# Patient Record
Sex: Female | Born: 1985 | Race: White | Hispanic: No | Marital: Single | Smoking: Former smoker
Health system: Southern US, Community
[De-identification: ages and names within clinical notes are randomized; demographics above are authoritative.]

## PROBLEM LIST (undated history)

## (undated) HISTORY — PX: WISDOM TOOTH EXTRACTION: SHX21

---

## 2020-12-24 ENCOUNTER — Encounter: Payer: Self-pay | Admitting: Internal Medicine

## 2020-12-24 ENCOUNTER — Other Ambulatory Visit: Payer: Self-pay

## 2020-12-24 ENCOUNTER — Ambulatory Visit: Payer: 59 | Admitting: Internal Medicine

## 2020-12-24 VITALS — BP 100/66 | HR 75 | Temp 98.6°F | Resp 16 | Ht 62.0 in | Wt 130.0 lb

## 2020-12-24 DIAGNOSIS — M542 Cervicalgia: Secondary | ICD-10-CM | POA: Diagnosis not present

## 2020-12-24 DIAGNOSIS — R519 Headache, unspecified: Secondary | ICD-10-CM

## 2020-12-24 DIAGNOSIS — R292 Abnormal reflex: Secondary | ICD-10-CM | POA: Diagnosis not present

## 2020-12-24 DIAGNOSIS — G4486 Cervicogenic headache: Secondary | ICD-10-CM | POA: Diagnosis not present

## 2020-12-24 DIAGNOSIS — Z124 Encounter for screening for malignant neoplasm of cervix: Secondary | ICD-10-CM

## 2020-12-24 DIAGNOSIS — Z0001 Encounter for general adult medical examination with abnormal findings: Secondary | ICD-10-CM | POA: Diagnosis not present

## 2020-12-24 LAB — LIPID PANEL
Cholesterol: 175 mg/dL (ref 0–200)
HDL: 77.8 mg/dL (ref >39.00)
LDL Cholesterol: 78 mg/dL (ref 0–99)
NonHDL: 97.15
Total CHOL/HDL Ratio: 2
Triglycerides: 95 mg/dL (ref 0.0–149.0)
VLDL: 19 mg/dL (ref 0.0–40.0)

## 2020-12-24 LAB — CBC WITH DIFFERENTIAL/PLATELET
Basophils Absolute: 0 10*3/uL (ref 0.0–0.1)
Basophils Relative: 0.3 % (ref 0.0–3.0)
Eosinophils Absolute: 0 10*3/uL (ref 0.0–0.7)
Eosinophils Relative: 0.5 % (ref 0.0–5.0)
HCT: 38 % (ref 36.0–46.0)
Hemoglobin: 12.9 g/dL (ref 12.0–15.0)
Lymphocytes Relative: 20 % (ref 12.0–46.0)
Lymphs Abs: 1.8 10*3/uL (ref 0.7–4.0)
MCHC: 34 g/dL (ref 30.0–36.0)
MCV: 92.8 fl (ref 78.0–100.0)
Monocytes Absolute: 0.5 10*3/uL (ref 0.1–1.0)
Monocytes Relative: 5.2 % (ref 3.0–12.0)
Neutro Abs: 6.5 10*3/uL (ref 1.4–7.7)
Neutrophils Relative %: 74 % (ref 43.0–77.0)
Platelets: 346 10*3/uL (ref 150.0–400.0)
RBC: 4.09 Mil/uL (ref 3.87–5.11)
RDW: 13.4 % (ref 11.5–15.5)
WBC: 8.8 10*3/uL (ref 4.0–10.5)

## 2020-12-24 LAB — HEPATIC FUNCTION PANEL
ALT: 11 U/L (ref 0–35)
AST: 16 U/L (ref 0–37)
Albumin: 4.4 g/dL (ref 3.5–5.2)
Alkaline Phosphatase: 38 U/L — ABNORMAL LOW (ref 39–117)
Bilirubin, Direct: 0.1 mg/dL (ref 0.0–0.3)
Total Bilirubin: 0.5 mg/dL (ref 0.2–1.2)
Total Protein: 7.2 g/dL (ref 6.0–8.3)

## 2020-12-24 LAB — BASIC METABOLIC PANEL
BUN: 13 mg/dL (ref 6–23)
CO2: 24 mEq/L (ref 19–32)
Calcium: 9.3 mg/dL (ref 8.4–10.5)
Chloride: 104 mEq/L (ref 96–112)
Creatinine, Ser: 0.81 mg/dL (ref 0.40–1.20)
GFR: 95.47 mL/min (ref >60.00)
Glucose, Bld: 82 mg/dL (ref 70–99)
Potassium: 3.9 mEq/L (ref 3.5–5.1)
Sodium: 137 mEq/L (ref 135–145)

## 2020-12-24 LAB — SEDIMENTATION RATE: Sed Rate: 8 mm/hr (ref 0–20)

## 2020-12-24 NOTE — Progress Notes (Signed)
Subjective:  Patient ID: Olivia Garner, female    DOB: [birth year]  Age: 35 y.o. MRN: [id number]  CC: Annual Exam and Headache  This visit occurred during the SARS-CoV-2 public health emergency.  Safety protocols were in place, including screening questions prior to the visit, additional usage of staff PPE, and extensive cleaning of exam room while observing appropriate contact time as indicated for disinfecting solutions.    HPI Olivia Garner presents for a CPX and to establish.  She complains of a 14-month history of right posterior headache that she describes as sharp in sensation.  She denies blurred vision, hearing disturbance, paresthesias, nausea, or vomiting.  She says the headaches last a few seconds and she feels stunned but then they quickly resolved.  She also has discomfort in her right upper neck that radiates into the occipital region.  History Olivia Garner has no past medical history on file.   She has a past surgical history that includes Wisdom tooth extraction.   Her family history includes Alcohol abuse in her maternal grandfather; Arthritis in her maternal grandmother and mother; Asthma in her maternal grandmother; Cancer in her maternal grandmother; Depression in her maternal grandmother and mother; Diabetes in her maternal grandmother; Drug abuse in her mother; Healthy in her father; Heart disease in her maternal grandmother and mother; Hyperlipidemia in her maternal grandmother; Hypertension in her mother; Kidney disease in her mother; Mental illness in her mother; Stroke in her maternal grandmother and mother.She reports that she has quit smoking. Her smoking use included cigarettes. She has never used smokeless tobacco. She reports that she does not drink alcohol and does not use drugs.  No outpatient medications prior to visit.   No facility-administered medications prior to visit.    ROS Review of Systems  Constitutional: Negative.   HENT: Negative.     Eyes: Negative.  Negative for photophobia and visual disturbance.  Respiratory:  Negative for cough and shortness of breath.   Cardiovascular:  Negative for chest pain, palpitations and leg swelling.  Gastrointestinal:  Negative for abdominal pain, diarrhea, nausea and vomiting.  Endocrine: Negative.   Genitourinary: Negative.   Musculoskeletal:  Positive for neck pain.  Skin: Negative.  Negative for color change and pallor.  Neurological:  Positive for headaches. Negative for dizziness, tremors, syncope, facial asymmetry, speech difficulty, weakness, light-headedness and numbness.  Hematological:  Negative for adenopathy. Does not bruise/bleed easily.  Psychiatric/Behavioral: Negative.     Objective:  BP 100/66 (BP Location: Right Arm, Patient Position: Sitting, Cuff Size: Large)   Pulse 75   Temp 98.6 F (37 C) (Oral)   Resp 16   Ht 5\' 2"  (1.575 m)   Wt 130 lb (59 kg)   LMP 12/10/2020 (Exact Date)   SpO2 98%   BMI 23.78 kg/m   Physical Exam HENT:     Nose: Nose normal.     Mouth/Throat:     Mouth: Mucous membranes are moist.  Eyes:     General: No scleral icterus.    Extraocular Movements: Extraocular movements intact.     Right eye: Normal extraocular motion and no nystagmus.     Left eye: Normal extraocular motion and no nystagmus.     Pupils: Pupils are equal, round, and reactive to light. Pupils are equal.  Cardiovascular:     Rate and Rhythm: Normal rate and regular rhythm.     Heart sounds: No murmur heard. Pulmonary:     Effort: Pulmonary effort is normal.  Breath sounds: No stridor. No wheezing, rhonchi or rales.  Abdominal:     General: Abdomen is flat.     Palpations: There is no mass.     Tenderness: There is no abdominal tenderness. There is no guarding.     Hernia: No hernia is present.  Musculoskeletal:        General: Normal range of motion.     Cervical back: Neck supple.     Right lower leg: No edema.     Left lower leg: No edema.  Skin:     General: Skin is warm and dry.  Neurological:     General: No focal deficit present.     Mental Status: She is alert.     Cranial Nerves: Cranial nerves are intact. No cranial nerve deficit.     Sensory: Sensation is intact. No sensory deficit.     Motor: Motor function is intact. No weakness.     Coordination: Coordination is intact. Coordination normal.     Gait: Gait normal.     Deep Tendon Reflexes: Reflexes abnormal. Babinski sign absent on the right side. Babinski sign absent on the left side.     Reflex Scores:      Tricep reflexes are 0 on the right side and 0 on the left side.      Bicep reflexes are 0 on the right side and 0 on the left side.      Brachioradialis reflexes are 0 on the right side and 0 on the left side.      Patellar reflexes are 1+ on the right side and 0 on the left side.      Achilles reflexes are 0 on the right side and 0 on the left side. Psychiatric:        Mood and Affect: Mood normal.    Lab Results  Component Value Date   WBC 8.8 12/24/2020   HGB 12.9 12/24/2020   HCT 38.0 12/24/2020   PLT 346.0 12/24/2020   GLUCOSE 82 12/24/2020   CHOL 175 12/24/2020   TRIG 95.0 12/24/2020   HDL 77.80 12/24/2020   LDLCALC 78 12/24/2020   ALT 11 12/24/2020   AST 16 12/24/2020   NA 137 12/24/2020   K 3.9 12/24/2020   CL 104 12/24/2020   CREATININE 0.81 12/24/2020   BUN 13 12/24/2020   CO2 24 12/24/2020   TSH 1.52 12/24/2020     Assessment & Plan:   Olivia Garner was seen today for annual exam and headache.  Diagnoses and all orders for this visit:  Encounter for general adult medical examination with abnormal findings- Exam completed, labs reviewed, vaccines are up-to-date, cancer screenings addressed, patient education is given. -     Lipid panel; Future -     HIV Antibody (routine testing w rflx); Future -     HIV Antibody (routine testing w rflx) -     Lipid panel  Cervicogenic headache- She has head and neck pain and an abnormal reflex.  I  recommended that she undergo an MRI/MRA to screen for aneurysm, mass, bleed, NPH, or cervical disc disease. Her labs are reassuring. -     CBC with Differential/Platelet; Future -     Basic metabolic panel; Future -     TSH; Future -     Hepatic function panel; Future -     Sedimentation rate; Future -     MR Cervical Spine Wo Contrast; Future -     Sedimentation rate -  Hepatic function panel -     TSH -     Basic metabolic panel -     CBC with Differential/Platelet  Nonintractable episodic headache, unspecified headache type -     CBC with Differential/Platelet; Future -     Basic metabolic panel; Future -     TSH; Future -     Hepatic function panel; Future -     Sedimentation rate; Future -     MR ANGIO HEAD WO W CONTRAST; Future -     MR Brain W Wo Contrast; Future -     Sedimentation rate -     Hepatic function panel -     TSH -     Basic metabolic panel -     CBC with Differential/Platelet  Reflexes abnormal -     CBC with Differential/Platelet; Future -     Basic metabolic panel; Future -     TSH; Future -     Hepatic function panel; Future -     Sedimentation rate; Future -     MR ANGIO HEAD WO W CONTRAST; Future -     MR Brain W Wo Contrast; Future -     MR Cervical Spine Wo Contrast; Future -     Sedimentation rate -     Hepatic function panel -     TSH -     Basic metabolic panel -     CBC with Differential/Platelet  Cervicalgia -     MR Cervical Spine Wo Contrast; Future  Olivia Garner does not currently have medications on file.  No orders of the defined types were placed in this encounter.    Follow-up: Return in about 3 months (around 03/26/2021).  Sanda Linger, MD

## 2020-12-24 NOTE — Patient Instructions (Signed)
General Headache Without Cause A headache is pain or discomfort felt around the head or neck area. The specific cause of a headache may not be found. There are many causes and types of headaches. A few common ones are: Tension headaches. Migraine headaches. Cluster headaches. Chronic daily headaches. Follow these instructions at home: Watch your condition for any changes. Let your health care provider know aboutthem. Take these steps to help with your condition: Managing pain     Take over-the-counter and prescription medicines only as told by your health care provider. Lie down in a dark, quiet room when you have a headache. If directed, put ice on your head and neck area: Put ice in a plastic bag. Place a towel between your skin and the bag. Leave the ice on for 20 minutes, 2-3 times per day. If directed, apply heat to the affected area. Use the heat source that your health care provider recommends, such as a moist heat pack or a heating pad. Place a towel between your skin and the heat source. Leave the heat on for 20-30 minutes. Remove the heat if your skin turns bright red. This is especially important if you are unable to feel pain, heat, or cold. You may have a greater risk of getting burned. Keep lights dim if bright lights bother you or make your headaches worse. Eating and drinking Eat meals on a regular schedule. If you drink alcohol: Limit how much you use to: 0-1 drink a day for women. 0-2 drinks a day for men. Be aware of how much alcohol is in your drink. In the [place] one drink equals one 12 oz bottle of beer (355 mL), one 5 oz glass of wine (148 mL), or one 1 oz glass of hard liquor (44 mL). Stop drinking caffeine, or decrease the amount of caffeine you drink. General instructions  Keep a headache journal to help find out what may trigger your headaches. For example, write down: What you eat and drink. How much sleep you get. Any change to your diet or  medicines. Try massage or other relaxation techniques. Limit stress. Sit up straight, and do not tense your muscles. Do not use any products that contain nicotine or tobacco, such as cigarettes, e-cigarettes, and chewing tobacco. If you need help quitting, ask your health care provider. Exercise regularly as told by your health care provider. Sleep on a regular schedule. Get 7-9 hours of sleep each night, or the amount recommended by your health care provider. Keep all follow-up visits as told by your health care provider. This is important.  Contact a health care provider if: Your symptoms are not helped by medicine. You have a headache that is different from the usual headache. You have nausea or you vomit. You have a fever. Get help right away if: Your headache becomes severe quickly. Your headache gets worse after moderate to intense physical activity. You have repeated vomiting. You have a stiff neck. You have a loss of vision. You have problems with speech. You have pain in the eye or ear. You have muscular weakness or loss of muscle control. You lose your balance or have trouble walking. You feel faint or pass out. You have confusion. You have a seizure. Summary A headache is pain or discomfort felt around the head or neck area. There are many causes and types of headaches. In some cases, the cause may not be found. Keep a headache journal to help find out what may trigger your headaches. Watch   your condition for any changes. Let your health care provider know about them. Contact a health care provider if you have a headache that is different from the usual headache, or if your symptoms are not helped by medicine. Get help right away if your headache becomes severe, you vomit, you have a loss of vision, you lose your balance, or you have a seizure. This information is not intended to replace advice given to you by your health care provider. Make sure you discuss any questions  you have with your healthcare provider. Document Revised: 12/12/2017 Document Reviewed: 12/12/2017 Elsevier Patient Education  2022 [employer]  

## 2020-12-25 LAB — HIV ANTIBODY (ROUTINE TESTING W REFLEX): HIV 1&2 Ab, 4th Generation: NONREACTIVE

## 2020-12-25 LAB — TSH: TSH: 1.52 u[IU]/mL (ref 0.35–5.50)

## 2020-12-28 ENCOUNTER — Encounter: Payer: Self-pay | Admitting: Internal Medicine

## 2020-12-28 DIAGNOSIS — Z124 Encounter for screening for malignant neoplasm of cervix: Secondary | ICD-10-CM | POA: Insufficient documentation

## 2021-01-09 ENCOUNTER — Other Ambulatory Visit: Payer: Managed Care, Other (non HMO)

## 2021-01-09 ENCOUNTER — Ambulatory Visit
Admission: RE | Admit: 2021-01-09 | Discharge: 2021-01-09 | Disposition: A | Discharge status: Home / Self Care | Payer: BC Managed Care – PPO | Source: Ambulatory Visit | Attending: Internal Medicine | Admitting: Internal Medicine

## 2021-01-09 DIAGNOSIS — R292 Abnormal reflex: Secondary | ICD-10-CM

## 2021-01-09 DIAGNOSIS — R519 Headache, unspecified: Secondary | ICD-10-CM

## 2021-01-09 DIAGNOSIS — G4486 Cervicogenic headache: Secondary | ICD-10-CM

## 2021-01-09 DIAGNOSIS — M542 Cervicalgia: Secondary | ICD-10-CM

## 2021-01-09 MED ORDER — GADOBENATE DIMEGLUMINE 529 MG/ML IV SOLN
12.0000 mL | Freq: Once | INTRAVENOUS | Status: AC | PRN
  Administered 2021-01-09: 12 mL via INTRAVENOUS

## 2021-01-10 ENCOUNTER — Other Ambulatory Visit: Payer: Self-pay | Admitting: Internal Medicine

## 2021-01-10 DIAGNOSIS — M4802 Spinal stenosis, cervical region: Secondary | ICD-10-CM | POA: Insufficient documentation

## 2021-01-28 ENCOUNTER — Encounter: Payer: Self-pay | Admitting: Physical Medicine and Rehabilitation

## 2021-02-24 DIAGNOSIS — N854 Malposition of uterus: Secondary | ICD-10-CM | POA: Insufficient documentation

## 2021-03-04 ENCOUNTER — Other Ambulatory Visit: Payer: Self-pay

## 2021-03-04 ENCOUNTER — Encounter
Payer: BC Managed Care – PPO | Attending: Physical Medicine and Rehabilitation | Admitting: Physical Medicine and Rehabilitation

## 2021-03-04 VITALS — BP 120/77 | HR 100 | Temp 98.9°F | Ht 62.0 in | Wt 131.2 lb

## 2021-03-04 DIAGNOSIS — M4802 Spinal stenosis, cervical region: Secondary | ICD-10-CM | POA: Diagnosis not present

## 2021-03-04 DIAGNOSIS — G4486 Cervicogenic headache: Secondary | ICD-10-CM | POA: Insufficient documentation

## 2021-03-04 DIAGNOSIS — M7918 Myalgia, other site: Secondary | ICD-10-CM | POA: Diagnosis not present

## 2021-03-04 NOTE — Progress Notes (Addendum)
Subjective:    Patient ID: Olivia Garner, female    DOB: 1985/10/30, 35 y.o.   MRN: [id number]  HPI Olivia Garner is a 35 year old woman who present to establish care for chronic neck pain.  1) Cervical foraminal stenosis -Had a cervical MRI and is not sure of the results. Believes she has a disc herniation -she has never done physical therapy and does not feel that she needs to at this time -she is very active in the gym with weights and wants to make sure this is safe for her -she has some right sided neck pain that is 5/10, pain right now is 0/10, and pain feels intermittent, sharp, and stabbing  2) Cervicogenic headache -located on the right side- feels tight  3) Cervical myofascial pain -has tight cervical muscles -plans to invest in massage  Pain Inventory Average Pain 5 Pain Right Now 0 My pain is intermittent, sharp, and stabbing  In the last 24 hours, has pain interfered with the following? General activity 0 Relation with others 0 Enjoyment of life 0 What TIME of day is your pain at its worst? morning  and daytime Sleep (in general) Good  Pain is worse with: inactivity and some activites Pain improves with: therapy/exercise Relief from Meds:  na  walk without assistance how many minutes can you walk? unlimited ability to climb steps?  yes do you drive?  yes  employed # of hrs/week 40+  tingling spasms dizziness depression anxiety  CT/MRI  Primary care Olivia Linger, MD    Family History  Problem Relation Age of Onset   Stroke Mother    Kidney disease Mother    Hypertension Mother    Heart disease Mother    Drug abuse Mother    Depression Mother    Arthritis Mother    Mental illness Mother    Healthy Father    Stroke Maternal Grandmother    Hyperlipidemia Maternal Grandmother    Heart disease Maternal Grandmother    Diabetes Maternal Grandmother    Depression Maternal Grandmother    Cancer Maternal Grandmother    Asthma Maternal  Grandmother    Arthritis Maternal Grandmother    Alcohol abuse Maternal Grandfather    Social History   Socioeconomic History   Marital status: Single    Spouse name: Not on file   Number of children: Not on file   Years of education: Not on file   Highest education level: Not on file  Occupational History   Not on file  Tobacco Use   Smoking status: Former    Types: Cigarettes   Smokeless tobacco: Never  Substance and Sexual Activity   Alcohol use: Never   Drug use: Never   Sexual activity: Yes    Partners: Male  Other Topics Concern   Not on file  Social History Narrative   Not on file   Social Determinants of Health   Financial Resource Strain: Not on file  Food Insecurity: Not on file  Transportation Needs: Not on file  Physical Activity: Not on file  Stress: Not on file  Social Connections: Not on file   Past Surgical History:  Procedure Laterality Date   WISDOM TOOTH EXTRACTION     No past medical history on file. BP 120/77   Pulse 100   Temp 98.9 F (37.2 C) (Oral)   Ht 5\' 2"  (1.575 m)   Wt 131 lb 3.2 oz (59.5 kg)   SpO2 97%   BMI 24.00  kg/m   Opioid Risk Score:   Fall Risk Score:  `1  Depression screen PHQ 2/9  Depression screen [facility] 2/9 03/04/2021 12/24/2020  Decreased Interest 1 0  Down, Depressed, Hopeless 1 0  PHQ - 2 Score 2 0  Altered sleeping 0 -  Tired, decreased energy 1 -  Change in appetite 0 -  Feeling bad or failure about yourself  2 -  Trouble concentrating 1 -  Moving slowly or fidgety/restless 0 -  Suicidal thoughts 0 -  PHQ-9 Score 6 -  Difficult doing work/chores Somewhat difficult -      Review of Systems  Musculoskeletal:  Positive for neck pain.  All other systems reviewed and are negative.     Objective:   Physical Exam Gen: no distress, normal appearing HEENT: oral mucosa pink and moist, NCAT Cardio: Reg rate Chest: normal effort, normal rate of breathing Abd: soft, non-distended Ext: no edema Psych:  pleasant, normal affect Skin: intact Neuro: Alert Musculoskeletal: Negative Spurling's test bilateral. +cervical lordosis, tight cervical myofascia    Assessment & Plan:   Olivia Garner is a 35 year old woman who present to establish care for chronic neck pain.  1) Cervical foraminal stenosis: -Discussed current symptoms of pain and history of pain.  -Discussed benefits of exercise in reducing pain. MRI Cervical Spine from August reviewed and shows following: Mild cervical spondylosis, as described and most notably as follows. At C5-C6, there is a small disc bulge. Superimposed shallow broad-based right center/foraminal disc protrusion. Minimal partial effacement of the ventral thecal sac (without spinal cord mass effect). Minimal right neural foraminal narrowing. Mild nonspecific reversal of the expected cervical lordosis. Discussed these results with patient -Discussed following foods that may reduce pain: 1) Ginger (especially studied for arthritis)- reduce leukotriene production to decrease inflammation 2) Blueberries- high in phytonutrients that decrease inflammation 3) Salmon- marine omega-3s reduce joint swelling and pain 4) Pumpkin seeds- reduce inflammation 5) dark chocolate- reduces inflammation 6) turmeric- reduces inflammation 7) tart cherries - reduce pain and stiffness 8) extra virgin olive oil - its compound olecanthal helps to block prostaglandins  9) chili peppers- can be eaten or applied topically via capsaicin 10) mint- helpful for headache, muscle aches, joint pain, and itching 11) garlic- reduces inflammation  Link to further information on diet for chronic pain: [email or web address]   2) Cervicogenic headache -Educated that this is a headache that often results from neck pain -Good cervical posture can help limit cervical myofascial pain and also limit cervicogenic headache -provided with home  exercise program -discussed benefits of PT and OT, will defer for now   3) cervical myofasical pain syndrome -discussed prescription muscle relaxers as an option if pain becomes particularly severe -recommended massage, heat application

## 2021-03-04 NOTE — Patient Instructions (Addendum)
Down Dog  -Discussed following foods that may reduce pain: 1) Ginger (especially studied for arthritis)- reduce leukotriene production to decrease inflammation 2) Blueberries- high in phytonutrients that decrease inflammation 3) Salmon- marine omega-3s reduce joint swelling and pain 4) Pumpkin seeds- reduce inflammation 5) dark chocolate- reduces inflammation 6) turmeric- reduces inflammation 7) tart cherries - reduce pain and stiffness 8) extra virgin olive oil - its compound olecanthal helps to block prostaglandins  9) chili peppers- can be eaten or applied topically via capsaicin 10) mint- helpful for headache, muscle aches, joint pain, and itching 11) garlic- reduces inflammation  Link to further information on diet for chronic pain: [email or web address]

## 2021-03-26 ENCOUNTER — Telehealth: Payer: BC Managed Care – PPO | Admitting: Physician Assistant

## 2021-03-26 DIAGNOSIS — J111 Influenza due to unidentified influenza virus with other respiratory manifestations: Secondary | ICD-10-CM | POA: Diagnosis not present

## 2021-03-26 MED ORDER — OSELTAMIVIR PHOSPHATE 75 MG PO CAPS
75.0000 mg | ORAL_CAPSULE | Freq: Two times a day (BID) | ORAL | 0 refills | Status: DC
Start: 2021-03-26 — End: 2021-10-07

## 2021-03-26 NOTE — Patient Instructions (Signed)
Olivia Garner, thank you for joining [name], PA-C for today's virtual visit.  While this provider is not your primary care provider (PCP), if your PCP is located in our provider database this encounter information will be shared with them immediately following your visit.  Consent: (Patient) Olivia Garner provided verbal consent for this virtual visit at the beginning of the encounter.  Current Medications:  Current Outpatient Medications:    oseltamivir (TAMIFLU) 75 MG capsule, Take 1 capsule (75 mg total) by mouth 2 (two) times daily., Disp: 10 capsule, Rfl: 0   Medications ordered in this encounter:  Meds ordered this encounter  Medications   oseltamivir (TAMIFLU) 75 MG capsule    Sig: Take 1 capsule (75 mg total) by mouth 2 (two) times daily.    Dispense:  10 capsule    Refill:  0    Order Specific Question:   Supervising Provider    Answer:   Hyacinth Meeker, BRIAN [[id number]]     *If you need refills on other medications prior to your next appointment, please contact your pharmacy*  Follow-Up: Call back or seek an in-person evaluation if the symptoms worsen or if the condition fails to improve as anticipated.  Other Instructions Influenza, Adult Influenza is also called "the flu." It is an infection in the lungs, nose, and throat (respiratory tract). It spreads easily from person to person (is contagious). The flu causes symptoms that are like a cold, along with high fever and body aches. What are the causes? This condition is caused by the influenza virus. You can get the virus by: Breathing in droplets that are in the air after a person infected with the flu coughed or sneezed. Touching something that has the virus on it and then touching your mouth, nose, or eyes. What increases the risk? Certain things may make you more likely to get the flu. These include: Not washing your hands often. Having close contact with many people during cold and flu  season. Touching your mouth, eyes, or nose without first washing your hands. Not getting a flu shot every year. You may have a higher risk for the flu, and serious problems, such as a lung infection (pneumonia), if you: Are older than 65. Are pregnant. Have a weakened disease-fighting system (immune system) because of a disease or because you are taking certain medicines. Have a long-term (chronic) condition, such as: Heart, kidney, or lung disease. Diabetes. Asthma. Have a liver disorder. Are very overweight (morbidly obese). Have anemia. What are the signs or symptoms? Symptoms usually begin suddenly and last 4-14 days. They may include: Fever and chills. Headaches, body aches, or muscle aches. Sore throat. Cough. Runny or stuffy (congested) nose. Feeling discomfort in your chest. Not wanting to eat as much as normal. Feeling weak or tired. Feeling dizzy. Feeling sick to your stomach or throwing up. How is this treated? If the flu is found early, you can be treated with antiviral medicine. This can help to reduce how bad the illness is and how long it lasts. This may be given by mouth or through an IV tube. Taking care of yourself at home can help your symptoms get better. Your doctor may want you to: Take over-the-counter medicines. Drink plenty of fluids. The flu often goes away on its own. If you have very bad symptoms or other problems, you may be treated in a hospital. Follow these instructions at home:   Activity Rest as needed. Get plenty of sleep. Stay home  from work or school as told by your doctor. Do not leave home until you do not have a fever for 24 hours without taking medicine. Leave home only to go to your doctor. Eating and drinking Take an ORS (oral rehydration solution). This is a drink that is sold at pharmacies and stores. Drink enough fluid to keep your pee pale yellow. Drink clear fluids in small amounts as you are able. Clear fluids  include: Water. Ice chips. Fruit juice mixed with water. Low-calorie sports drinks. Eat bland foods that are easy to digest. Eat small amounts as you are able. These foods include: Bananas. Applesauce. Rice. Lean meats. Toast. Crackers. Do not eat or drink: Fluids that have a lot of sugar or caffeine. Alcohol. Spicy or fatty foods. General instructions Take over-the-counter and prescription medicines only as told by your doctor. Use a cool mist humidifier to add moisture to the air in your home. This can make it easier for you to breathe. When using a cool mist humidifier, clean it daily. Empty water and replace with clean water. Cover your mouth and nose when you cough or sneeze. Wash your hands with soap and water often and for at least 20 seconds. This is also important after you cough or sneeze. If you cannot use soap and water, use alcohol-based hand sanitizer. Keep all follow-up visits. How is this prevented?  Get a flu shot every year. You may get the flu shot in late summer, fall, or winter. Ask your doctor when you should get your flu shot. Avoid contact with people who are sick during fall and winter. This is cold and flu season. Contact a doctor if: You get new symptoms. You have: Chest pain. Watery poop (diarrhea). A fever. Your cough gets worse. You start to have more mucus. You feel sick to your stomach. You throw up. Get help right away if you: Have shortness of breath. Have trouble breathing. Have skin or nails that turn a bluish color. Have very bad pain or stiffness in your neck. Get a sudden headache. Get sudden pain in your face or ear. Cannot eat or drink without throwing up. These symptoms may represent a serious problem that is an emergency. Get medical help right away. Call your local emergency services (911 in the [place] Do not wait to see if the symptoms will go away. Do not drive yourself to the hospital. Summary Influenza is also called  "the flu." It is an infection in the lungs, nose, and throat. It spreads easily from person to person. Take over-the-counter and prescription medicines only as told by your doctor. Getting a flu shot every year is the best way to not get the flu. This information is not intended to replace advice given to you by your health care provider. Make sure you discuss any questions you have with your health care provider. Document Revised: 01/11/2020 Document Reviewed: 01/11/2020 Elsevier Patient Education  2022 [employer].    If you have been instructed to have an in-person evaluation today at a local Urgent Care facility, please use the link below. It will take you to a list of all of our available [facility], including address, phone number and hours of operation. Please do not delay care.  [facility]  If you or a family member do not have a primary care provider, use the link below to schedule a visit and establish care. When you choose a [facility] primary care physician or advanced practice  provider, you gain a long-term partner in health. Find a Primary Care Provider  Learn more about [facility] in-office and virtual care options: [facility]

## 2021-03-26 NOTE — Progress Notes (Signed)
Virtual Visit Consent   Olivia Garner, you are scheduled for a virtual visit with a [facility] provider today.     Just as with appointments in the office, your consent must be obtained to participate.  Your consent will be active for this visit and any virtual visit you may have with one of our providers in the next 365 days.     If you have a MyChart account, a copy of this consent can be sent to you electronically.  All virtual visits are billed to your insurance company just like a traditional visit in the office.    As this is a virtual visit, video technology does not allow for your provider to perform a traditional examination.  This may limit your provider's ability to fully assess your condition.  If your provider identifies any concerns that need to be evaluated in person or the need to arrange testing (such as labs, EKG, etc.), we will make arrangements to do so.     Although advances in technology are sophisticated, we cannot ensure that it will always work on either your end or our end.  If the connection with a video visit is poor, the visit may have to be switched to a telephone visit.  With either a video or telephone visit, we are not always able to ensure that we have a secure connection.     I need to obtain your verbal consent now.   Are you willing to proceed with your visit today?    Olivia Garner has provided verbal consent on 03/26/2021 for a virtual visit (video or telephone).   [name], PA-C   Date: 03/26/2021 8:35 AM   Virtual Visit via Video Note   I, [name], connected with  Olivia Garner  ([id number], 09/28/1985) on 03/26/21 at  8:30 AM EDT by a video-enabled telemedicine application and verified that I am speaking with the correct person using two identifiers.  Location: Patient: Virtual Visit Location Patient: Home Provider: Virtual Visit Location Provider: Home Office   I discussed the limitations of evaluation  and management by telemedicine and the availability of in person appointments. The patient expressed understanding and agreed to proceed.    History of Present Illness: Olivia Garner is a 35 y.o. who identifies as a female who was assigned female at birth, and is being seen today for flu-like illness.  HPI: URI  This is a new problem. The problem has been gradually worsening. There has been no fever. Associated symptoms include congestion, coughing (dry), rhinorrhea, sinus pain and a sore throat. Pertinent negatives include no diarrhea, ear pain, nausea, plugged ear sensation or vomiting. Associated symptoms comments: Had laryngitis yesterday, body aches, fatigue. Treatments tried: mucinex, dayquil. The treatment provided mild relief.  No known sick contacts.  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  Resting comfortably at home.  Head is normocephalic, atraumatic.  No labored breathing.  Speech is clear and coherent with logical content.  Patient is alert and oriented at baseline.    Assessment and Plan: 1. Influenza - oseltamivir (TAMIFLU) 75 MG capsule; Take 1 capsule (75 mg total) by mouth 2 (two) times daily.  Dispense: 10 capsule; Refill: 0  - Suspected viral URI with negative covid testing - Possible flu as flu A is prevalent in the community  at this time - Limited testing availability that would delay appropriate treatment waiting on results - Tamiflu prescribed for possible flu - Work note provided - Push fluids - Symptomatic management OTC of choice as needed - Seek in person evaluation if symptoms worsen or fail to improve  Follow Up Instructions: I discussed the assessment and treatment plan with the patient. The patient was provided an opportunity to ask questions and all were answered. The patient agreed with the plan and demonstrated an understanding of the instructions.  A copy of instructions were sent to the patient via MyChart unless otherwise noted below.   The patient was advised to call back or seek an in-person evaluation if the symptoms worsen or if the condition fails to improve as anticipated.  Time:  I spent 11 minutes with the patient via telehealth technology discussing the above problems/concerns.    [name], PA-C

## 2021-04-22 LAB — HM PAP SMEAR

## 2021-09-30 ENCOUNTER — Ambulatory Visit: Payer: BC Managed Care – PPO | Admitting: Family Medicine

## 2021-10-07 ENCOUNTER — Encounter: Payer: Self-pay | Admitting: Family Medicine

## 2021-10-07 ENCOUNTER — Ambulatory Visit: Payer: BC Managed Care – PPO | Admitting: Family Medicine

## 2021-10-07 VITALS — BP 110/74 | HR 88 | Temp 97.7°F | Ht 62.0 in | Wt 130.0 lb

## 2021-10-07 DIAGNOSIS — R002 Palpitations: Secondary | ICD-10-CM | POA: Diagnosis not present

## 2021-10-07 LAB — COMPREHENSIVE METABOLIC PANEL
ALT: 11 U/L (ref 0–35)
AST: 17 U/L (ref 0–37)
Albumin: 4.4 g/dL (ref 3.5–5.2)
Alkaline Phosphatase: 51 U/L (ref 39–117)
BUN: 13 mg/dL (ref 6–23)
CO2: 26 mEq/L (ref 19–32)
Calcium: 9.4 mg/dL (ref 8.4–10.5)
Chloride: 106 mEq/L (ref 96–112)
Creatinine, Ser: 0.8 mg/dL (ref 0.40–1.20)
GFR: 96.37 mL/min (ref >60.00)
Glucose, Bld: 85 mg/dL (ref 70–99)
Potassium: 3.8 mEq/L (ref 3.5–5.1)
Sodium: 139 mEq/L (ref 135–145)
Total Bilirubin: 1 mg/dL (ref 0.2–1.2)
Total Protein: 7.4 g/dL (ref 6.0–8.3)

## 2021-10-07 LAB — CBC WITH DIFFERENTIAL/PLATELET
Basophils Absolute: 0 10*3/uL (ref 0.0–0.1)
Basophils Relative: 0.3 % (ref 0.0–3.0)
Eosinophils Absolute: 0 10*3/uL (ref 0.0–0.7)
Eosinophils Relative: 0.7 % (ref 0.0–5.0)
HCT: 39.7 % (ref 36.0–46.0)
Hemoglobin: 13.5 g/dL (ref 12.0–15.0)
Lymphocytes Relative: 31.3 % (ref 12.0–46.0)
Lymphs Abs: 1.8 10*3/uL (ref 0.7–4.0)
MCHC: 33.9 g/dL (ref 30.0–36.0)
MCV: 92.7 fl (ref 78.0–100.0)
Monocytes Absolute: 0.4 10*3/uL (ref 0.1–1.0)
Monocytes Relative: 6.3 % (ref 3.0–12.0)
Neutro Abs: 3.5 10*3/uL (ref 1.4–7.7)
Neutrophils Relative %: 61.4 % (ref 43.0–77.0)
Platelets: 319 10*3/uL (ref 150.0–400.0)
RBC: 4.29 Mil/uL (ref 3.87–5.11)
RDW: 13.5 % (ref 11.5–15.5)
WBC: 5.6 10*3/uL (ref 4.0–10.5)

## 2021-10-07 LAB — T4, FREE: Free T4: 0.77 ng/dL (ref 0.60–1.60)

## 2021-10-07 LAB — TSH: TSH: 1.34 u[IU]/mL (ref 0.35–5.50)

## 2021-10-07 NOTE — Progress Notes (Signed)
? ?Acute Office Visit ? ?Subjective:  ? ?  ?Patient ID: Olivia Garner, female    DOB: 1986-02-26, 36 y.o.   MRN: [id number] ? ?Chief Complaint  ?Patient presents with  ? Palpitations  ?  Since July of last year noticed "flutters" more often. Happens every day  ? ? ?HPI ?Patient is in today for palpitations. Intermittent since July 2022. Becoming more frequent, has been daily for the past 6 months. Occurs while exercising in the morning, normally at the end of her workout. Lasts only a few seconds.  ?She also notices a fast heart rate in the evening. Does not notice it during the day.  ?Feels a fluttering sensation in her chest. At times she feels like a "jump" in her chest and loses her breath.  ? ?States her pulse with exercise has been as high as 180-202.  ?She was doing a 5K and had to stop running and walk.  ? ?Denies syncope but has felt faint before.  ? ?Denies fever, chills, dizziness, chest pain, shortness of breath, abdominal pain, N/V/D, urinary symptoms, LE edema.  ? ? ?She typically does not consume a lot of caffeine. States she has 1 cup of coffee in the morning. No nicotine ? ? ?No known family hx of cardiac disease or sudden death ? ?LMP: regular monthly cycles, not heavy  ?On OCPs.  ? ?History reviewed. No pertinent past medical history. ?Current Outpatient Medications on File Prior to Visit  ?Medication Sig Dispense Refill  ? norgestimate-ethinyl estradiol (ORTHO-CYCLEN) 0.25-35 MG-MCG tablet Sprintec (28) 0.25 mg-35 mcg tablet    ? ?No current facility-administered medications on file prior to visit.  ? ? ? ? ? ?ROS ?Pertinent positives and negatives in the history of present illness. ? ? ?   ?Objective:  ?  ?BP 110/74 (BP Location: Left Arm, Patient Position: Sitting, Cuff Size: Large)   Pulse 88   Temp 97.7 ?F (36.5 ?C) (Temporal)   Ht 5\' 2"  (1.575 m)   Wt 130 lb (59 kg)   LMP 09/02/2021 (Within Days)   SpO2 98%   BMI 23.78 kg/m?  ?BP Readings from Last 3 Encounters:  ?10/07/21 110/74   ?03/04/21 120/77  ?12/24/20 100/66  ? ?Wt Readings from Last 3 Encounters:  ?10/07/21 130 lb (59 kg)  ?03/04/21 131 lb 3.2 oz (59.5 kg)  ?12/24/20 130 lb (59 kg)  ? ?  ? ?Physical Exam ?Constitutional:   ?   Appearance: Normal appearance. She is not ill-appearing or diaphoretic.  ?Eyes:  ?   Conjunctiva/sclera: Conjunctivae normal.  ?Cardiovascular:  ?   Rate and Rhythm: Normal rate and regular rhythm.  ?   Pulses: Normal pulses.  ?   Heart sounds: Normal heart sounds.  ?Pulmonary:  ?   Effort: Pulmonary effort is normal.  ?   Breath sounds: Normal breath sounds.  ?Musculoskeletal:  ?   Cervical back: Normal range of motion and neck supple.  ?Skin: ?   General: Skin is warm and dry.  ?Neurological:  ?   General: No focal deficit present.  ?   Mental Status: She is alert and oriented to person, place, and time.  ?Psychiatric:     ?   Mood and Affect: Mood normal.     ?   Behavior: Behavior normal.  ? ? ?Results for orders placed or performed in visit on 10/07/21  ?T4, free  ?Result Value Ref Range  ? Free T4 0.77 0.60 - 1.60 ng/dL  ?TSH  ?Result Value  Ref Range  ? TSH 1.34 0.35 - 5.50 uIU/mL  ?Comprehensive metabolic panel  ?Result Value Ref Range  ? Sodium 139 135 - 145 mEq/L  ? Potassium 3.8 3.5 - 5.1 mEq/L  ? Chloride 106 96 - 112 mEq/L  ? CO2 26 19 - 32 mEq/L  ? Glucose, Bld 85 70 - 99 mg/dL  ? BUN 13 6 - 23 mg/dL  ? Creatinine, Ser 0.80 0.40 - 1.20 mg/dL  ? Total Bilirubin 1.0 0.2 - 1.2 mg/dL  ? Alkaline Phosphatase 51 39 - 117 U/L  ? AST 17 0 - 37 U/L  ? ALT 11 0 - 35 U/L  ? Total Protein 7.4 6.0 - 8.3 g/dL  ? Albumin 4.4 3.5 - 5.2 g/dL  ? GFR 96.37 >60.00 mL/min  ? Calcium 9.4 8.4 - 10.5 mg/dL  ?CBC with Differential/Platelet  ?Result Value Ref Range  ? WBC 5.6 4.0 - 10.5 K/uL  ? RBC 4.29 3.87 - 5.11 Mil/uL  ? Hemoglobin 13.5 12.0 - 15.0 g/dL  ? HCT 39.7 36.0 - 46.0 %  ? MCV 92.7 78.0 - 100.0 fl  ? MCHC 33.9 30.0 - 36.0 g/dL  ? RDW 13.5 11.5 - 15.5 %  ? Platelets 319.0 150.0 - 400.0 K/uL  ? Neutrophils  Relative % 61.4 43.0 - 77.0 %  ? Lymphocytes Relative 31.3 12.0 - 46.0 %  ? Monocytes Relative 6.3 3.0 - 12.0 %  ? Eosinophils Relative 0.7 0.0 - 5.0 %  ? Basophils Relative 0.3 0.0 - 3.0 %  ? Neutro Abs 3.5 1.4 - 7.7 K/uL  ? Lymphs Abs 1.8 0.7 - 4.0 K/uL  ? Monocytes Absolute 0.4 0.1 - 1.0 K/uL  ? Eosinophils Absolute 0.0 0.0 - 0.7 K/uL  ? Basophils Absolute 0.0 0.0 - 0.1 K/uL  ? ? ? ?   ?Assessment & Plan:  ? ?Problem List Items Addressed This Visit   ? ?  ? Other  ? Palpitations - Primary  ?  Intermittent and ongoing since July 2022, worsening. No chest pain or syncope history.  ?EKG shows NSR, rate 81, no acute changes  ?I will check labs including thyroid panel and also get a 2 week event monitor.  ?Follow up if worsening or pending results.  ? ?  ?  ? Relevant Orders  ? EKG 12-Lead  ? Cardiac event monitor  ? CBC with Differential/Platelet (Completed)  ? Comprehensive metabolic panel (Completed)  ? TSH (Completed)  ? T4, free (Completed)  ? ?Visit time 22 minutes in face to face communication with patient and coordination of care, additional 12 minutes spent in record review, coordination or care, ordering tests, communicating/referring to other healthcare professionals, documenting in medical records all on the same day of the visit for total time 34 minutes spent on the visit.  ? ? ?No orders of the defined types were placed in this encounter. ? ? ?No follow-ups on file. ? ?Hetty Blend, NP-C ? ? ?

## 2021-10-07 NOTE — Patient Instructions (Signed)
Go to the first floor for labs before you leave today. ? ?I am ordering a 2-week cardiac event monitor for you.  You should be contacted in the next couple of days regarding getting this set up. ? ?We will be in touch with your results. ? ? ?Palpitations ?Palpitations are feelings that your heartbeat is irregular or is faster than normal. It may feel like your heart is fluttering or skipping a beat. Palpitations may be caused by many things, including smoking, caffeine, alcohol, stress, and certain medicines or drugs. Most causes of palpitations are not serious.  ?However, some palpitations can be a sign of a serious problem. Further tests and a thorough medical history will be done to find the cause of your palpitations. Your provider may order tests such as an ECG, labs, an echocardiogram, or an ambulatory continuous ECG monitor. ?Follow these instructions at home: ?Pay attention to any changes in your symptoms. Let your health care provider know about them. Take these actions to help manage your symptoms: ?Eating and drinking ?Follow instructions from your health care provider about eating or drinking restrictions. You may need to avoid foods and drinks that may cause palpitations. These may include: ?Caffeinated coffee, tea, soft drinks, and energy drinks. ?Chocolate. ?Alcohol. ?Diet pills. ?Lifestyle ? ?  ? ?Take steps to reduce your stress and anxiety. Things that can help you relax include: ?Yoga. ?Mind-body activities, such as deep breathing, meditation, or using words and images to create positive thoughts (guided imagery). ?Physical activity, such as swimming, jogging, or walking. Tell your health care provider if your palpitations increase with activity. If you have chest pain or shortness of breath with activity, do not continue the activity until you are seen by your health care provider. ?Biofeedback. This is a method that helps you learn to use your mind to control things in your body, such as your  heartbeat. ?Get plenty of rest and sleep. Keep a regular bed time. ?Do not use drugs, including cocaine or ecstasy. Do not use marijuana. ?Do not use any products that contain nicotine or tobacco. These products include cigarettes, chewing tobacco, and vaping devices, such as e-cigarettes. If you need help quitting, ask your health care provider. ?General instructions ?Take over-the-counter and prescription medicines only as told by your health care provider. ?Keep all follow-up visits. This is important. These may include visits for further testing if palpitations do not go away or get worse. ?Contact a health care provider if: ?You continue to have a fast or irregular heartbeat for a long period of time. ?You notice that your palpitations occur more often. ?Get help right away if: ?You have chest pain or shortness of breath. ?You have a severe headache. ?You feel dizzy or you faint. ?These symptoms may represent a serious problem that is an emergency. Do not wait to see if the symptoms will go away. Get medical help right away. Call your local emergency services (911 in the [place] Do not drive yourself to the hospital. ?Summary ?Palpitations are feelings that your heartbeat is irregular or is faster than normal. It may feel like your heart is fluttering or skipping a beat. ?Palpitations may be caused by many things, including smoking, caffeine, alcohol, stress, certain medicines, and drugs. ?Further tests and a thorough medical history may be done to find the cause of your palpitations. ?Get help right away if you faint or have chest pain, shortness of breath, severe headache, or dizziness. ?This information is not intended to replace advice given to  you by your health care provider. Make sure you discuss any questions you have with your health care provider. ?Document Revised: 10/15/2020 Document Reviewed: 10/15/2020 ?Elsevier Patient Education ? [employer]. ? ?

## 2021-10-07 NOTE — Assessment & Plan Note (Addendum)
Intermittent and ongoing since July 2022, worsening. No chest pain or syncope history.  ?EKG shows NSR, rate 81, no acute changes  ?I will check labs including thyroid panel and also get a 2 week event monitor.  ?Follow up if worsening or pending results.  ?

## 2021-10-29 ENCOUNTER — Ambulatory Visit (INDEPENDENT_AMBULATORY_CARE_PROVIDER_SITE_OTHER): Payer: BC Managed Care – PPO

## 2021-10-29 DIAGNOSIS — R002 Palpitations: Secondary | ICD-10-CM

## 2021-11-04 ENCOUNTER — Ambulatory Visit: Payer: BC Managed Care – PPO | Admitting: Internal Medicine

## 2021-12-02 NOTE — Progress Notes (Signed)
Please call her with my recommendations and if she is having any new or worsening symptoms as mentioned in my note, we can refer her to cardiology.

## 2021-12-29 ENCOUNTER — Telehealth: Payer: Self-pay | Admitting: Internal Medicine

## 2021-12-29 NOTE — Telephone Encounter (Signed)
Pt due for OV. She has been scheduled for 7/31 @ 11am.  CMA will hold immunization form until that time.

## 2021-12-29 NOTE — Telephone Encounter (Signed)
PT visits today with an immunization record form to be filled out. Form has been left in Dr.Jones' mailbox. PT would like to be notified once this is filled out.  CB: [phone]

## 2022-01-04 ENCOUNTER — Encounter: Payer: Self-pay | Admitting: Internal Medicine

## 2022-01-04 ENCOUNTER — Ambulatory Visit: Payer: BC Managed Care – PPO | Admitting: Internal Medicine

## 2022-01-04 VITALS — BP 104/66 | HR 92 | Temp 98.5°F | Ht 62.0 in | Wt 134.0 lb

## 2022-01-04 DIAGNOSIS — Z0000 Encounter for general adult medical examination without abnormal findings: Secondary | ICD-10-CM | POA: Diagnosis not present

## 2022-01-04 DIAGNOSIS — Z0001 Encounter for general adult medical examination with abnormal findings: Secondary | ICD-10-CM

## 2022-01-04 NOTE — Patient Instructions (Signed)

## 2022-01-04 NOTE — Progress Notes (Signed)
Subjective:  Patient ID: Olivia Garner, female    DOB: 12-Jan-1986  Age: 36 y.o. MRN: [id number]  CC: Annual Exam   HPI Olivia Garner presents for a CPX and f/up -  She needs documentation of titers and vaccines to enter graduate school.  She feels well and offers no complaints.  History Olivia Garner has no past medical history on file.   She has a past surgical history that includes Wisdom tooth extraction.   Her family history includes Alcohol abuse in her maternal grandfather; Arthritis in her maternal grandmother and mother; Asthma in her maternal grandmother; Cancer in her maternal grandmother; Depression in her maternal grandmother and mother; Diabetes in her maternal grandmother; Drug abuse in her mother; Healthy in her father; Heart disease in her maternal grandmother and mother; Hyperlipidemia in her maternal grandmother; Hypertension in her mother; Kidney disease in her mother; Mental illness in her mother; Stroke in her maternal grandmother and mother.She reports that she has quit smoking. Her smoking use included cigarettes. She has never used smokeless tobacco. She reports that she does not drink alcohol and does not use drugs.  Outpatient Medications Prior to Visit  Medication Sig Dispense Refill   norgestimate-ethinyl estradiol (ORTHO-CYCLEN) 0.25-35 MG-MCG tablet Sprintec (28) 0.25 mg-35 mcg tablet     No facility-administered medications prior to visit.    ROS Review of Systems  All other systems reviewed and are negative.   Objective:  BP 104/66 (BP Location: Left Arm, Patient Position: Sitting, Cuff Size: Large)   Pulse 92   Temp 98.5 F (36.9 C) (Oral)   Ht 5\' 2"  (1.575 m)   Wt 134 lb (60.8 kg)   SpO2 98%   BMI 24.51 kg/m   Physical Exam Vitals reviewed.  HENT:     Nose: Nose normal.     Mouth/Throat:     Mouth: Mucous membranes are moist.  Eyes:     General: No scleral icterus.    Conjunctiva/sclera: Conjunctivae normal.  Cardiovascular:      Rate and Rhythm: Normal rate and regular rhythm.     Heart sounds: No murmur heard. Pulmonary:     Effort: Pulmonary effort is normal.     Breath sounds: No stridor. No wheezing, rhonchi or rales.  Abdominal:     General: Abdomen is flat.     Palpations: There is no mass.     Tenderness: There is no abdominal tenderness. There is no guarding.     Hernia: No hernia is present.  Musculoskeletal:        General: Normal range of motion.     Cervical back: Neck supple.     Right lower leg: No edema.     Left lower leg: No edema.  Lymphadenopathy:     Cervical: No cervical adenopathy.  Skin:    General: Skin is warm and dry.  Neurological:     General: No focal deficit present.     Mental Status: She is alert.  Psychiatric:        Mood and Affect: Mood normal.        Behavior: Behavior normal.     Lab Results  Component Value Date   WBC 5.6 10/07/2021   HGB 13.5 10/07/2021   HCT 39.7 10/07/2021   PLT 319.0 10/07/2021   GLUCOSE 85 10/07/2021   CHOL 175 12/24/2020   TRIG 95.0 12/24/2020   HDL 77.80 12/24/2020   LDLCALC 78 12/24/2020   ALT 11 10/07/2021   AST 17 10/07/2021  NA 139 10/07/2021   K 3.8 10/07/2021   CL 106 10/07/2021   CREATININE 0.80 10/07/2021   BUN 13 10/07/2021   CO2 26 10/07/2021   TSH 1.34 10/07/2021     Assessment & Plan:   Olivia Garner was seen today for annual exam.  Diagnoses and all orders for this visit:  Encounter for general adult medical examination with abnormal findings-Exam completed, labs reviewed, vaccines are up-to-date, cancer screenings are up-to-date, patient education was given. -     Rubella screen; Future -     Rubeola antibody IgG; Future -     QuantiFERON-TB Gold Plus; Future -     Mumps antibody, IgG; Future -     Rubella screen -     Rubeola antibody IgG -     QuantiFERON-TB Gold Plus -     Mumps antibody, IgG   I am having Olivia Garner maintain her norgestimate-ethinyl estradiol.  No orders of the defined types  were placed in this encounter.    Follow-up: Return if symptoms worsen or fail to improve.  Sanda Linger, MD

## 2022-01-06 LAB — MUMPS ANTIBODY, IGG: Mumps IgG: <9 AU/mL — ABNORMAL LOW

## 2022-01-06 LAB — RUBEOLA ANTIBODY IGG: Rubeola IgG: 125 AU/mL

## 2022-01-06 LAB — QUANTIFERON-TB GOLD PLUS
Mitogen-NIL: 8.9 IU/mL
NIL: 0.01 IU/mL
QuantiFERON-TB Gold Plus: NEGATIVE
TB1-NIL: 0 IU/mL
TB2-NIL: 0.01 IU/mL

## 2022-01-06 LAB — RUBELLA SCREEN: Rubella: 1.44 Index

## 2022-01-22 ENCOUNTER — Encounter: Payer: Self-pay | Admitting: Internal Medicine

## 2022-12-16 ENCOUNTER — Telehealth: Payer: BC Managed Care – PPO | Admitting: Physician Assistant

## 2022-12-16 DIAGNOSIS — H109 Unspecified conjunctivitis: Secondary | ICD-10-CM | POA: Diagnosis not present

## 2022-12-16 MED ORDER — POLYMYXIN B-TRIMETHOPRIM 10000-0.1 UNIT/ML-% OP SOLN: 1.0000 [drp] | OPHTHALMIC | 0 refills | Status: DC

## 2022-12-16 NOTE — Progress Notes (Signed)
Virtual Visit Consent   Olivia Garner, you are scheduled for a virtual visit with a Olivia Garner provider today. Just as with appointments in the office, your consent must be obtained to participate. Your consent will be active for this visit and any virtual visit you may have with one of our providers in the next 365 days. If you have a MyChart account, a copy of this consent can be sent to you electronically.  As this is a virtual visit, video technology does not allow for your provider to perform a traditional examination. This may limit your provider's ability to fully assess your condition. If your provider identifies any concerns that need to be evaluated in person or the need to arrange testing (such as labs, EKG, etc.), we will make arrangements to do so. Although advances in technology are sophisticated, we cannot ensure that it will always work on either your end or our end. If the connection with a video visit is poor, the visit may have to be switched to a telephone visit. With either a video or telephone visit, we are not always able to ensure that we have a secure connection.  By engaging in this virtual visit, you consent to the provision of healthcare and authorize for your insurance to be billed (if applicable) for the services provided during this visit. Depending on your insurance coverage, you may receive a charge related to this service.  I need to obtain your verbal consent now. Are you willing to proceed with your visit today? [name] has provided verbal consent on 12/16/2022 for a virtual visit (video or telephone). [name], PA-C  Date: 12/16/2022 7:40 AM  Virtual Visit via Video Note   I, [name], connected with  Olivia Garner  ([id number], 06/21/1985) on 12/16/22 at  7:45 AM EDT by a video-enabled telemedicine application and verified that I am speaking with the correct person using two identifiers.  Location: Patient: Virtual  Visit Location Patient: Home Provider: Virtual Visit Location Provider: Home Office   I discussed the limitations of evaluation and management by telemedicine and the availability of in person appointments. The patient expressed understanding and agreed to proceed.    History of Present Illness: Olivia Garner is a 37 y.o. who identifies as a female who was assigned female at birth, and is being seen today for right eye irritation.  HPI: Eye Problem  The right eye is affected. This is a new problem. The current episode started yesterday (Around 3pm felt like she got an eyelash in her eye, tried to flush without improvement. Does still feel like something is in her eye and it moves around). The problem occurs constantly. The problem has been gradually worsening. There was no injury mechanism. The pain is mild. There is No known exposure to pink eye. She Wears contacts (havent worn since Monday). Associated symptoms include blurred vision, an eye discharge, eye redness and a foreign body sensation. Pertinent negatives include no double vision or fever. Treatments tried: cold compresses, eye wash. The treatment provided no relief.     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  Resting comfortably at home.  Head is normocephalic, atraumatic.  No labored breathing.  Speech is clear and coherent with logical content.   Patient is alert and oriented at baseline.  Right eye is injected with mild swelling of the upper and lower eyelids. Eyelids are erythematous as well  Assessment and Plan: 1. Bacterial conjunctivitis of right eye - trimethoprim-polymyxin b (POLYTRIM) ophthalmic solution; Place 1 drop into the right eye every 4 (four) hours.  Dispense: 10 mL; Refill: 0  - Suspect bacterial conjunctivitis - Polytrim prescribed - Cold compresses - Good hand hygiene - Seek in person evaluation if symptoms worsen or fail to improve   Follow Up Instructions: I discussed the assessment and treatment plan with the patient. The patient was provided an opportunity to ask questions and all were answered. The patient agreed with the plan and demonstrated an understanding of the instructions.  A copy of instructions were sent to the patient via MyChart unless otherwise noted below.    The patient was advised to call back or seek an in-person evaluation if the symptoms worsen or if the condition fails to improve as anticipated.  Time:  I spent 10 minutes with the patient via telehealth technology discussing the above problems/concerns.    [name], PA-C

## 2022-12-16 NOTE — Patient Instructions (Signed)
Olivia Garner, thank you for joining [name], PA-C for today's virtual visit.  While this provider is not your primary care provider (PCP), if your PCP is located in our provider database this encounter information will be shared with them immediately following your visit.   A [facility] MyChart account gives you access to today's visit and all your visits, tests, and labs performed at [facility] " click here if you don't have a [facility] MyChart account or go to [email or web address]  Consent: (Patient) Olivia Garner provided verbal consent for this virtual visit at the beginning of the encounter.  Current Medications:  Current Outpatient Medications:    trimethoprim-polymyxin b (POLYTRIM) ophthalmic solution, Place 1 drop into the right eye every 4 (four) hours., Disp: 10 mL, Rfl: 0   norgestimate-ethinyl estradiol (ORTHO-CYCLEN) 0.25-35 MG-MCG tablet, Sprintec (28) 0.25 mg-35 mcg tablet, Disp: , Rfl:    Medications ordered in this encounter:  Meds ordered this encounter  Medications   trimethoprim-polymyxin b (POLYTRIM) ophthalmic solution    Sig: Place 1 drop into the right eye every 4 (four) hours.    Dispense:  10 mL    Refill:  0    Order Specific Question:   Supervising Provider    Answer:   Merrilee Jansky [[id number]]     *If you need refills on other medications prior to your next appointment, please contact your pharmacy*  Follow-Up: Call back or seek an in-person evaluation if the symptoms worsen or if the condition fails to improve as anticipated.  [facility] [phone]  Other Instructions Bacterial Conjunctivitis, Adult Bacterial conjunctivitis is an infection of the clear membrane that covers the white part of the eye and the inner surface of the eyelid (conjunctiva). When the blood vessels in the conjunctiva become inflamed, the eye becomes red or pink. The eye often feels irritated or itchy. Bacterial  conjunctivitis spreads easily from person to person (is contagious). It also spreads easily from one eye to the other eye. What are the causes? This condition is caused by bacteria. You may get the infection if you come into close contact with: A person who is infected with the bacteria. Items that are contaminated with the bacteria, such as a face towel, contact lens solution, or eye makeup. What increases the risk? You are more likely to develop this condition if: You are exposed to other people who have the infection. You wear contact lenses. You have a sinus infection. You have had a recent eye injury or surgery. You have a weak body defense system (immune system). You have a medical condition that causes dry eyes. What are the signs or symptoms? Symptoms of this condition include: Thick, yellowish discharge from the eye. This may turn into a crust on the eyelid overnight and cause your eyelids to stick together. Tearing or watery eyes. Itchy eyes. Burning feeling in your eyes. Eye redness. Swollen eyelids. Blurred vision. How is this diagnosed? This condition is diagnosed based on your symptoms and medical history. Your health care provider may also take a sample of discharge from your eye to find the cause of your infection. How is this treated? This condition may be treated with: Antibiotic eye drops or ointment to clear the infection more quickly and prevent the spread of infection to others. Antibiotic medicines taken by mouth (orally) to treat infections that do not respond to drops or ointments or that last longer than 10 days. Cool, wet cloths (cool compresses)  placed on the eyes. Artificial tears applied 2-6 times a day. Follow these instructions at home: Medicines Take or apply your antibiotic medicine as told by your health care provider. Do not stop using the antibiotic, even if your condition improves, unless directed by your health care provider. Take or apply  over-the-counter and prescription medicines only as told by your health care provider. Be very careful to avoid touching the edge of your eyelid with the eye-drop bottle or the ointment tube when you apply medicines to the affected eye. This will keep you from spreading the infection to your other eye or to other people. Managing discomfort Gently wipe away any drainage from your eye with a warm, wet washcloth or a cotton ball. Apply a clean, cool compress to your eye for 10-20 minutes, 3-4 times a day. General instructions Do not wear contact lenses until the inflammation is gone and your health care provider says it is safe to wear them again. Ask your health care provider how to sterilize or replace your contact lenses before you use them again. Wear glasses until you can resume wearing contact lenses. Avoid wearing eye makeup until the inflammation is gone. Throw away any old eye cosmetics that may be contaminated. Change or wash your pillowcase every day. Do not share towels or washcloths. This may spread the infection. Wash your hands often with soap and water for at least 20 seconds and especially before touching your face or eyes. Use paper towels to dry your hands. Avoid touching or rubbing your eyes. Do not drive or use heavy machinery if your vision is blurred. Contact a health care provider if: You have a fever. Your symptoms do not get better after 10 days. Get help right away if: You have a fever and your symptoms suddenly get worse. You have severe pain when you move your eye. You have facial pain, redness, or swelling. You have a sudden loss of vision. Summary Bacterial conjunctivitis is an infection of the clear membrane that covers the white part of the eye and the inner surface of the eyelid (conjunctiva). Bacterial conjunctivitis spreads easily from eye to eye and from person to person (is contagious). Wash your hands often with soap and water for at least 20 seconds and  especially before touching your face or eyes. Use paper towels to dry your hands. Take or apply your antibiotic medicine as told by your health care provider. Do not stop using the antibiotic even if your condition improves. Contact a health care provider if you have a fever or if your symptoms do not get better after 10 days. Get help right away if you have a sudden loss of vision. This information is not intended to replace advice given to you by your health care provider. Make sure you discuss any questions you have with your health care provider. Document Revised: 09/03/2020 Document Reviewed: 09/03/2020 Elsevier Patient Education  2024 Elsevier Inc.    If you have been instructed to have an in-person evaluation today at a local Urgent Care facility, please use the link below. It will take you to a list of all of our available [facility], including address, phone number and hours of operation. Please do not delay care.  [facility]  If you or a family member do not have a primary care provider, use the link below to schedule a visit and establish care. When you choose a [facility] primary care physician or advanced practice provider, you  gain a long-term partner in health. Find a Primary Care Provider  Learn more about [facility]'s in-office and virtual care options: [facility] - Get Care Now

## 2023-02-05 ENCOUNTER — Telehealth: Payer: BLUE CROSS/BLUE SHIELD | Admitting: Nurse Practitioner

## 2023-02-05 DIAGNOSIS — L089 Local infection of the skin and subcutaneous tissue, unspecified: Secondary | ICD-10-CM | POA: Diagnosis not present

## 2023-02-05 MED ORDER — MUPIROCIN 2 % EX OINT: 1.0000 | TOPICAL_OINTMENT | Freq: Two times a day (BID) | CUTANEOUS | 0 refills | Status: DC

## 2023-02-05 NOTE — Progress Notes (Signed)
Virtual Visit Consent   Olivia Garner, you are scheduled for a virtual visit with a [facility] provider today. Just as with appointments in the office, your consent must be obtained to participate. Your consent will be active for this visit and any virtual visit you may have with one of our providers in the next 365 days. If you have a MyChart account, a copy of this consent can be sent to you electronically.  As this is a virtual visit, video technology does not allow for your provider to perform a traditional examination. This may limit your provider's ability to fully assess your condition. If your provider identifies any concerns that need to be evaluated in person or the need to arrange testing (such as labs, EKG, etc.), we will make arrangements to do so. Although advances in technology are sophisticated, we cannot ensure that it will always work on either your end or our end. If the connection with a video visit is poor, the visit may have to be switched to a telephone visit. With either a video or telephone visit, we are not always able to ensure that we have a secure connection.  By engaging in this virtual visit, you consent to the provision of healthcare and authorize for your insurance to be billed (if applicable) for the services provided during this visit. Depending on your insurance coverage, you may receive a charge related to this service.  I need to obtain your verbal consent now. Are you willing to proceed with your visit today? Di Jaleeza Shimp has provided verbal consent on 02/05/2023 for a virtual visit (video or telephone). Claiborne Rigg, NP  Date: 02/05/2023 10:07 AM  Virtual Visit via Video Note   I, Claiborne Rigg, connected with  Olivia Garner  ([id number], 08-24-1985) on 02/05/23 at 10:15 AM EDT by a video-enabled telemedicine application and verified that I am speaking with the correct person using two identifiers.  Location: Patient: Virtual Visit  Location Patient: Home Provider: Virtual Visit Location Provider: Home Office   I discussed the limitations of evaluation and management by telemedicine and the availability of in person appointments. The patient expressed understanding and agreed to proceed.    History of Present Illness: Olivia Garner is a 37 y.o. who identifies as a female who was assigned female at birth, and is being seen today for superficial cat bite.  Ms Mcfield was bitten on her right forearm by a stray cat. She has a small area of erythema on the medial forearm with no purulent drainage or area of infection spreading.    Problems:  Patient Active Problem List   Diagnosis Date Noted   Foraminal stenosis of cervical region 01/10/2021   Encounter for general adult medical examination with abnormal findings 12/24/2020    Allergies: No Known Allergies Medications:  Current Outpatient Medications:    mupirocin ointment (BACTROBAN) 2 %, Apply 1 Application topically 2 (two) times daily., Disp: 30 g, Rfl: 0   norgestimate-ethinyl estradiol (ORTHO-CYCLEN) 0.25-35 MG-MCG tablet, Sprintec (28) 0.25 mg-35 mcg tablet, Disp: , Rfl:    trimethoprim-polymyxin b (POLYTRIM) ophthalmic solution, Place 1 drop into the right eye every 4 (four) hours., Disp: 10 mL, Rfl: 0  Observations/Objective: Patient is well-developed, well-nourished in no acute distress.  Resting comfortably at home.  Head is normocephalic, atraumatic.  No labored breathing.  Speech is clear and coherent with logical content.  Patient is alert and oriented at baseline.    Assessment and Plan: 1. Skin  infection - mupirocin ointment (BACTROBAN) 2 %; Apply 1 Application topically 2 (two) times daily.  Dispense: 30 g; Refill: 0   Follow Up Instructions: I discussed the assessment and treatment plan with the patient. The patient was provided an opportunity to ask questions and all were answered. The patient agreed with the plan and demonstrated an  understanding of the instructions.  A copy of instructions were sent to the patient via MyChart unless otherwise noted below.    The patient was advised to call back or seek an in-person evaluation if the symptoms worsen or if the condition fails to improve as anticipated.  Time:  I spent 12 minutes with the patient via telehealth technology discussing the above problems/concerns.    Claiborne Rigg, NP

## 2023-02-05 NOTE — Patient Instructions (Signed)
  Olivia Garner, thank you for joining Claiborne Rigg, NP for today's virtual visit.  While this provider is not your primary care provider (PCP), if your PCP is located in our provider database this encounter information will be shared with them immediately following your visit.   A [facility] MyChart account gives you access to today's visit and all your visits, tests, and labs performed at [facility] " click here if you don't have a [facility] MyChart account or go to [email or web address]  Consent: (Patient) Olivia Garner provided verbal consent for this virtual visit at the beginning of the encounter.  Current Medications:  Current Outpatient Medications:    mupirocin ointment (BACTROBAN) 2 %, Apply 1 Application topically 2 (two) times daily., Disp: 30 g, Rfl: 0   norgestimate-ethinyl estradiol (ORTHO-CYCLEN) 0.25-35 MG-MCG tablet, Sprintec (28) 0.25 mg-35 mcg tablet, Disp: , Rfl:    trimethoprim-polymyxin b (POLYTRIM) ophthalmic solution, Place 1 drop into the right eye every 4 (four) hours., Disp: 10 mL, Rfl: 0   Medications ordered in this encounter:  Meds ordered this encounter  Medications   mupirocin ointment (BACTROBAN) 2 %    Sig: Apply 1 Application topically 2 (two) times daily.    Dispense:  30 g    Refill:  0    Order Specific Question:   Supervising Provider    Answer:   Merrilee Jansky [id number]     *If you need refills on other medications prior to your next appointment, please contact your pharmacy*  Follow-Up: Call back or seek an in-person evaluation if the symptoms worsen or if the condition fails to improve as anticipated.  [facility] [phone]    If you have been instructed to have an in-person evaluation today at a local Urgent Care facility, please use the link below. It will take you to a list of all of our available [facility], including address, phone number and hours of operation.  Please do not delay care.  [facility]  If you or a family member do not have a primary care provider, use the link below to schedule a visit and establish care. When you choose a [facility] primary care physician or advanced practice provider, you gain a long-term partner in health. Find a Primary Care Provider  Learn more about [facility]'s in-office and virtual care options: [facility] - Get Care Now

## 2023-02-11 IMAGING — MR MR MRA HEAD W/O CM
1 series · 23 of 48 positions shown · non-contrast
Comparison: Same-day MRI head and MR cervical spine 01/09/2021.

CLINICAL DATA: Nonintractable episodic headache, unspecified
headache type IF6.D (59L-FA-CM). Headache, new or worsening, neuro
deficit (Age 19-49y). Reflexes abnormal V3F.3 (59L-FA-CM).
Additional history provided by scanning technologist: Patient
reports sharp pain/headaches in lower right portion of head and neck
since July 2020.

EXAM:
MRA HEAD WITHOUT CONTRAST
TECHNIQUE: Angiographic images of the Circle of Willis were acquired using MRA
technique without intravenous contrast.

[Series 3: tof_3d_multi-slab · axial · 0.7mm · 0.35mm/px · z∈[-56,+43]mm · 23 of 149 slices shown]
[im 1/149]
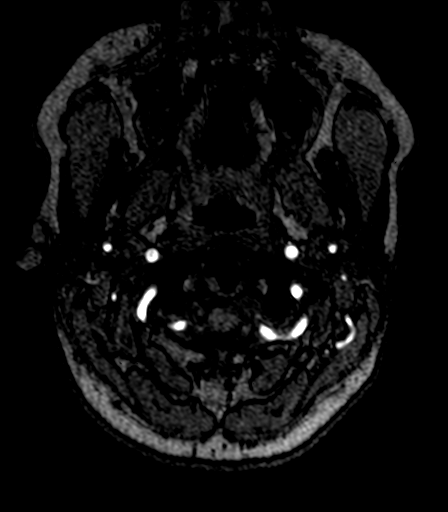
[im 4/149]
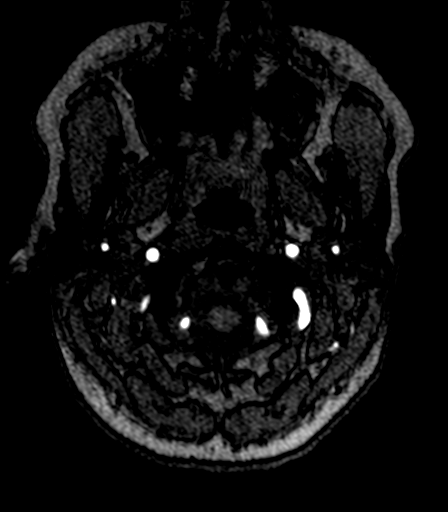
[im 7/149]
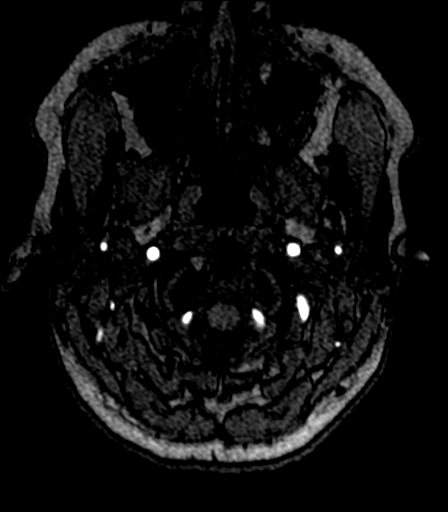
[im 10/149]
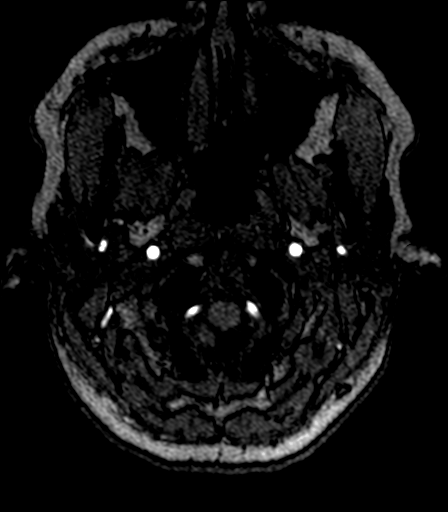
[im 13/149]
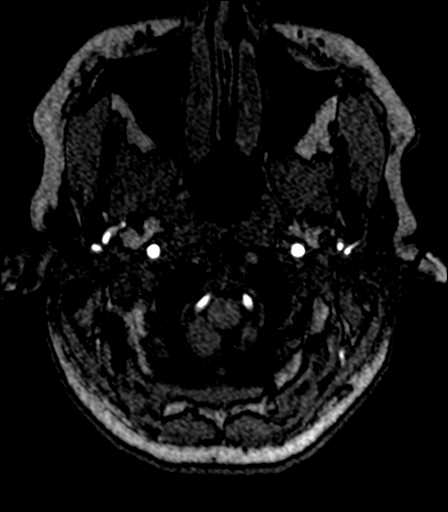
[im 16/149]
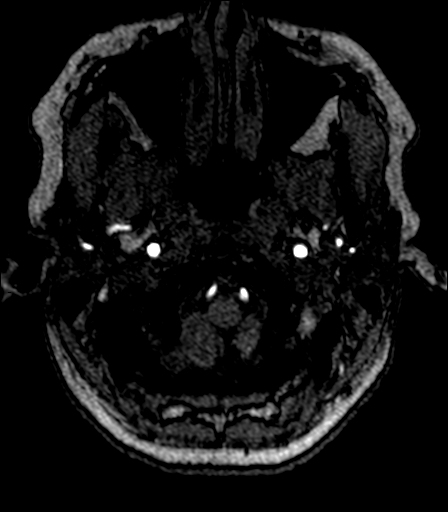
[im 19/149]
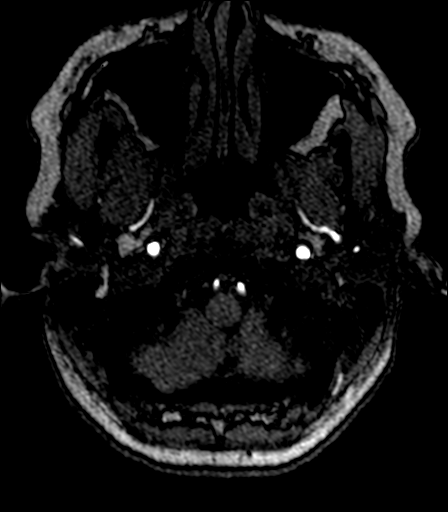
[im 23/149]
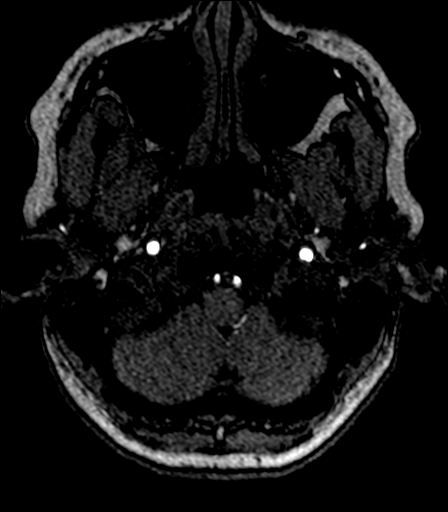
[im 26/149]
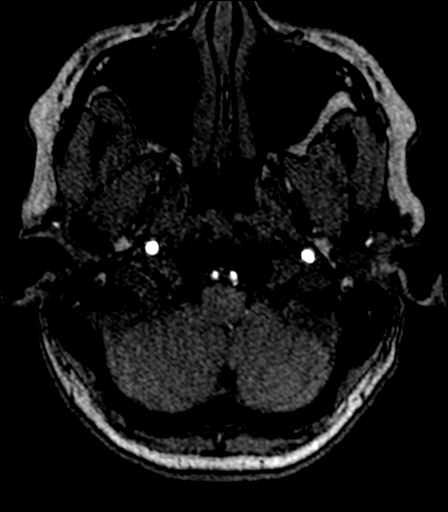
[im 29/149]
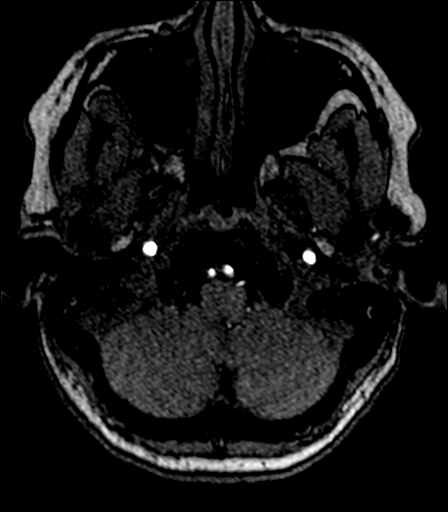
[im 32/149]
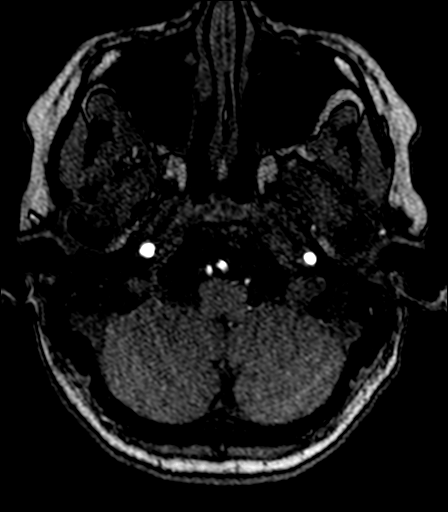
[im 35/149]
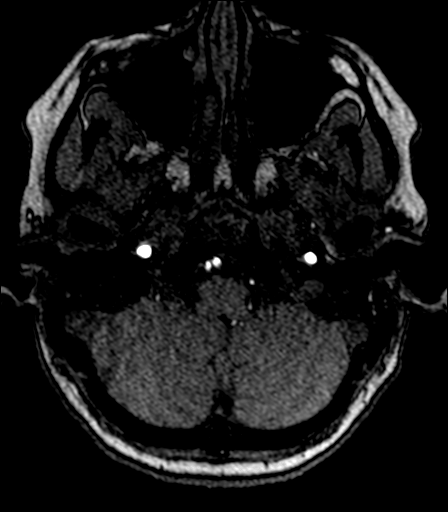
[im 38/149]
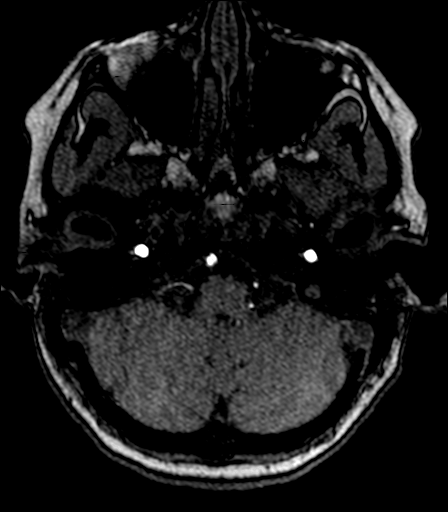
[im 41/149]
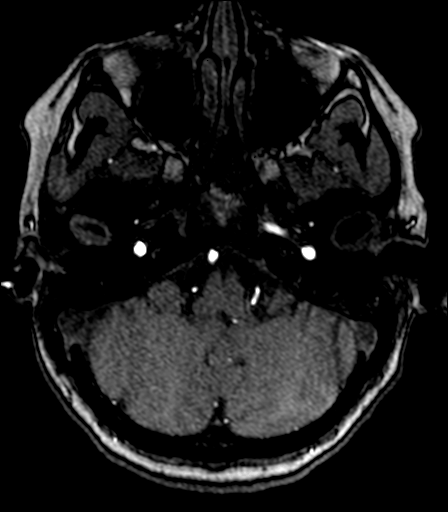
[im 45/149]
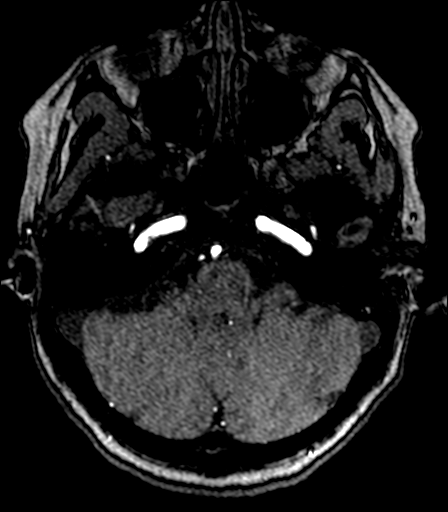
[im 48/149]
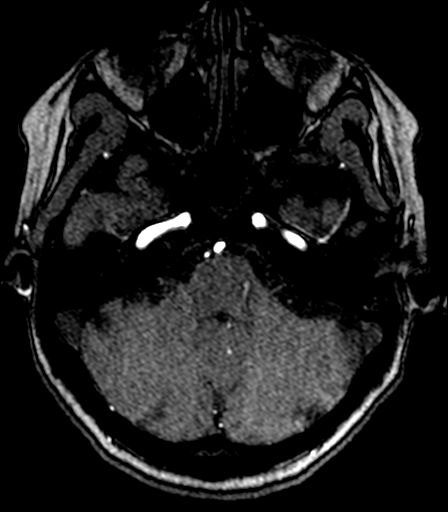
[im 67/149]
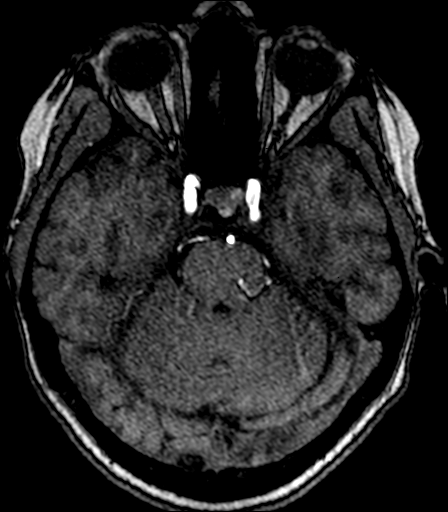
[im 76/149]
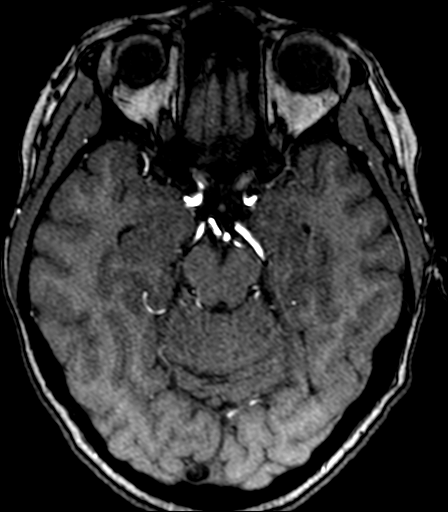
[im 86/149]
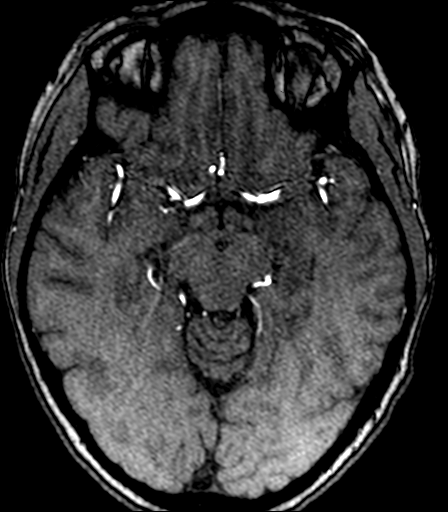
[im 104/149]
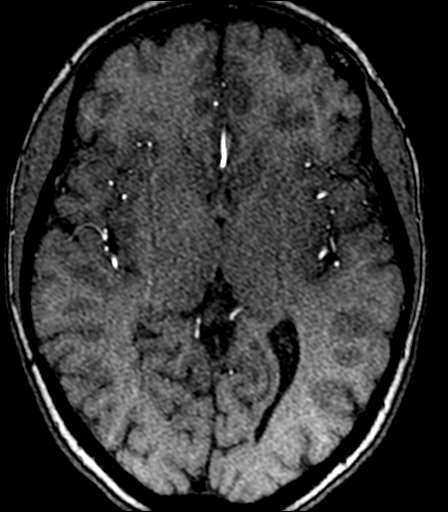
[im 123/149]
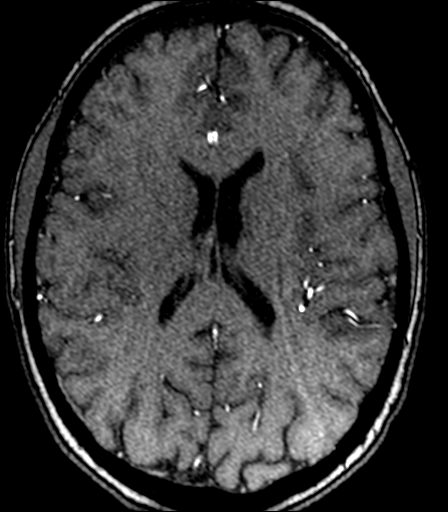
[im 126/149]
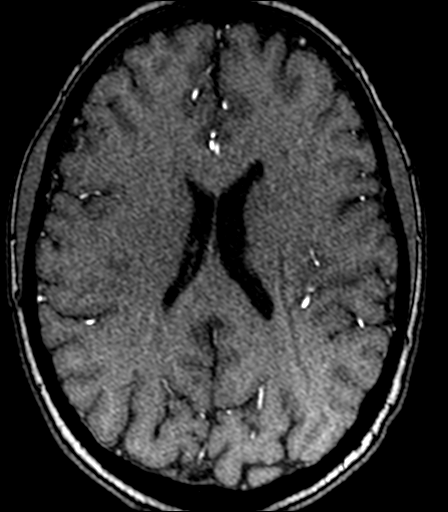
[im 142/149]
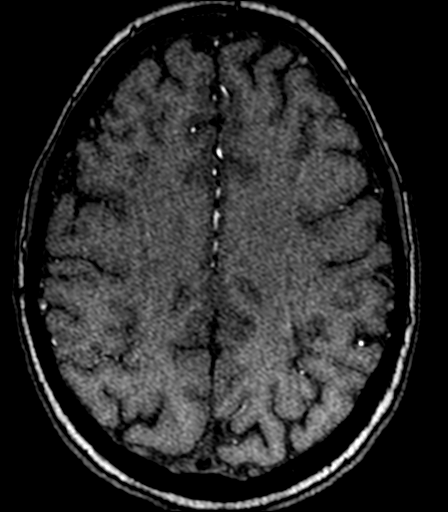

[23 of 48 positions shown; findings below may reference images not displayed]

FINDINGS: Anterior circulation:

The intracranial internal carotid arteries are patent. The M1 middle
cerebral arteries are patent. No M2 proximal branch occlusion or
high-grade proximal stenosis is identified. The anterior cerebral
arteries are patent. No intracranial aneurysm is identified.

Posterior circulation:

The intracranial vertebral arteries are patent. The basilar artery
is patent. The posterior cerebral arteries are patent. Posterior
communicating arteries are present bilaterally.

Anatomic variants: None significant
IMPRESSION: Unremarkable exam. No intracranial large vessel occlusion or
proximal high-grade arterial stenosis.

No intracranial aneurysm is identified.

## 2024-01-30 ENCOUNTER — Ambulatory Visit (INDEPENDENT_AMBULATORY_CARE_PROVIDER_SITE_OTHER): Admitting: Internal Medicine

## 2024-01-30 VITALS — BP 120/84 | HR 99 | Temp 98.8°F | Resp 16 | Ht 62.0 in | Wt 131.4 lb

## 2024-01-30 DIAGNOSIS — R41840 Attention and concentration deficit: Secondary | ICD-10-CM | POA: Insufficient documentation

## 2024-01-30 NOTE — Progress Notes (Signed)
 Subjective:  Patient ID: Olivia Garner, female    DOB: Mar 22, 1986  Age: 38 y.o. MRN: [id number]  CC: Evaluation for ADHD (Sleep effected, lack of focus. )   HPI Olivia Garner presents for f/up ---  Discussed the use of AI scribe software for clinical note transcription with the patient, who gave verbal consent to proceed.  History of Present Illness Olivia Garner is a 38 year old female who presents with difficulty focusing and non-restorative sleep.  She has been experiencing difficulty focusing and non-restorative sleep since transitioning to remote work and returning to school in 2023. 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 She recently had to drop a class due to her inability to focus and retain information, despite being open to new learning styles.  Her sleep is disturbed by persistent thoughts, leading to a lack of rejuvenation upon waking. No snoring or sleep apnea is reported, and her sleep is not affected by external disturbances such as pets. Despite sleeping, she feels as though she is not rested and describes her sleep as non-restorative.  During the day, her mood is generally even, but maintaining this requires significant effort, including meditation and managing tasks in short increments to avoid burnout. She experiences frustration and disappointment, which often leads to stress eating. Despite increased eating, she has not gained weight compared to two years ago.  She experiences significant decision fatigue, a topic she previously discussed with a therapist when she moved to [place]  to prevent her career from defining her life. There have been conversations about slight depression and anxiety, but she has managed these through holistic practices and has not received formal treatment.  She is not currently taking any medications to enhance  alertness during the day, but occasionally consumes one cup of coffee or a Cels[employer] energy drink before evening workouts. Her last menstrual cycle was last week, and she uses birth control, with no chance of pregnancy.     Outpatient Medications Prior to Visit  Medication Sig Dispense Refill   norgestimate-ethinyl estradiol (ORTHO-CYCLEN) 0.25-35 MG-MCG tablet Sprintec (28) 0.25 mg-35 mcg tablet     mupirocin  ointment (BACTROBAN ) 2 % Apply 1 Application topically 2 (two) times daily. 30 g 0   trimethoprim -polymyxin b  (POLYTRIM ) ophthalmic solution Place 1 drop into the right eye every 4 (four) hours. 10 mL 0   No facility-administered medications prior to visit.    ROS Review of Systems  Constitutional:  Negative for appetite change, chills, diaphoresis, fatigue and fever.  HENT: Negative.    Eyes:  Negative for visual disturbance.  Respiratory:  Negative for cough, chest tightness, shortness of breath and wheezing.   Cardiovascular:  Negative for chest pain, palpitations and leg swelling.  Gastrointestinal: Negative.  Negative for abdominal pain, constipation, diarrhea, nausea and vomiting.  Endocrine: Negative.   Genitourinary: Negative.  Negative for difficulty urinating.  Musculoskeletal: Negative.  Negative for arthralgias.  Skin: Negative.   Neurological:  Negative for dizziness.  Hematological:  Negative for adenopathy. Does not bruise/bleed easily.  Psychiatric/Behavioral:  Positive for decreased concentration and sleep disturbance. Negative for confusion and dysphoric mood. The patient is not nervous/anxious.     Objective:  BP 120/84 (BP Location: Left Arm, Patient Position: Sitting, Cuff Size: Normal)   Pulse 99   Temp 98.8 F (37.1 C) (Oral)   Resp 16   Ht 5' 2 (1.575 m)   Wt  131 lb 6.4 oz (59.6 kg)   LMP 01/23/2024 (Exact Date)   SpO2 98%   BMI 24.03 kg/m   BP Readings from Last 3 Encounters:  01/30/24 120/84  01/04/22 104/66  10/07/21 110/74    Wt  Readings from Last 3 Encounters:  01/30/24 131 lb 6.4 oz (59.6 kg)  01/04/22 134 lb (60.8 kg)  10/07/21 130 lb (59 kg)    Physical Exam Vitals reviewed.  Constitutional:      Appearance: Normal appearance.  HENT:     Mouth/Throat:     Mouth: Mucous membranes are moist.  Eyes:     General: No scleral icterus.    Conjunctiva/sclera: Conjunctivae normal.  Cardiovascular:     Rate and Rhythm: Normal rate and regular rhythm.     Heart sounds: No murmur heard.    No friction rub. No gallop.  Pulmonary:     Effort: Pulmonary effort is normal.     Breath sounds: No stridor. No wheezing, rhonchi or rales.  Abdominal:     General: Abdomen is flat.     Palpations: There is no mass.     Tenderness: There is no abdominal tenderness. There is no guarding.     Hernia: No hernia is present.  Musculoskeletal:        General: Normal range of motion.     Cervical back: Neck supple.     Right lower leg: No edema.     Left lower leg: No edema.  Lymphadenopathy:     Cervical: No cervical adenopathy.  Skin:    General: Skin is warm and dry.  Neurological:     General: No focal deficit present.     Mental Status: She is alert.  Psychiatric:        Mood and Affect: Mood normal.        Behavior: Behavior normal.     Lab Results  Component Value Date   WBC 5.6 10/07/2021   HGB 13.5 10/07/2021   HCT 39.7 10/07/2021   PLT 319.0 10/07/2021   GLUCOSE 85 10/07/2021   CHOL 175 12/24/2020   TRIG 95.0 12/24/2020   HDL 77.80 12/24/2020   LDLCALC 78 12/24/2020   ALT 11 10/07/2021   AST 17 10/07/2021   NA 139 10/07/2021   K 3.8 10/07/2021   CL 106 10/07/2021   CREATININE 0.80 10/07/2021   BUN 13 10/07/2021   CO2 26 10/07/2021   TSH 1.34 10/07/2021    MR Cervical Spine Wo Contrast Result Date: 01/09/2021 CLINICAL DATA:  Cervicogenic headache G44.86 (ICD-10-CM). Neck pain, chronic, no prior imaging. Reflexes abnormal R29.2 (ICD-10-CM). Cervicalgia M54.2 (ICD-10-CM). Additional history  provided by scanning technologist: Patient reports headache in lower right portion of head and neck since February 2022. EXAM: MRI CERVICAL SPINE WITHOUT CONTRAST TECHNIQUE: Multiplanar, multisequence MR imaging of the cervical spine was performed. No intravenous contrast was administered. COMPARISON:  Same-day MRI/MRA head 01/09/2021. FINDINGS: Alignment: Mild reversal of the expected cervical lordosis. No significant spondylolisthesis. Vertebrae: Vertebral body height is maintained. No significant marrow edema or focal suspicious osseous lesion. Cord: No spinal cord signal abnormality is identified. Posterior Fossa, vertebral arteries, paraspinal tissues: Posterior fossa better assessed on same-day brain MRI. Flow voids preserved within the imaged cervical vertebral arteries. Paraspinal soft tissues unremarkable. Disc levels: Minimal multilevel disc degeneration. C2-C3: No significant disc herniation or stenosis. C3-C4: Mild uncovertebral hypertrophy on the right. No significant disc herniation or stenosis. C4-C5: No significant disc herniation or stenosis. C5-C6: Small disc bulge. Superimposed shallow  broad-based right center/right foraminal disc protrusion. Minimal partial effacement of the ventral thecal sac (without spinal cord mass effect). Minimal relative narrowing of the right neural foramen. C6-C7: No significant disc herniation or stenosis. C7-T1: No significant disc herniation or stenosis. IMPRESSION: Mild cervical spondylosis, as described and most notably as follows. At C5-C6, there is a small disc bulge. Superimposed shallow broad-based right center/foraminal disc protrusion. Minimal partial effacement of the ventral thecal sac (without spinal cord mass effect). Minimal right neural foraminal narrowing. Mild nonspecific reversal of the expected cervical lordosis. Electronically Signed   By: Rockey Childs DO   On: 01/09/2021 19:15   MR ANGIO HEAD WO CONTRAST Result Date: 01/09/2021 CLINICAL DATA:   Nonintractable episodic headache, unspecified headache type R51.9 (ICD-10-CM). Headache, new or worsening, neuro deficit (Age 23-49y). Reflexes abnormal R29.2 (ICD-10-CM). Additional history provided by scanning technologist: Patient reports sharp pain/headaches in lower right portion of head and neck since February 2022. EXAM: MRA HEAD WITHOUT CONTRAST TECHNIQUE: Angiographic images of the Circle of Willis were acquired using MRA technique without intravenous contrast. COMPARISON:  Same-day MRI head and MR cervical spine 01/09/2021. FINDINGS: Anterior circulation: The intracranial internal carotid arteries are patent. The M1 middle cerebral arteries are patent. No M2 proximal branch occlusion or high-grade proximal stenosis is identified. The anterior cerebral arteries are patent. No intracranial aneurysm is identified. Posterior circulation: The intracranial vertebral arteries are patent. The basilar artery is patent. The posterior cerebral arteries are patent. Posterior communicating arteries are present bilaterally. Anatomic variants: None significant IMPRESSION: Unremarkable exam. No intracranial large vessel occlusion or proximal high-grade arterial stenosis. No intracranial aneurysm is identified. Electronically Signed   By: Rockey Childs DO   On: 01/09/2021 19:06   MR Brain W Wo Contrast Result Date: 01/09/2021 CLINICAL DATA:  Nonintractable episodic headache, unspecified headache type R51.9 (ICD-10-CM). Headache, new or worsening, neuro deficit (Age 20-49y). Reflexes abnormal R29.2 (ICD-10-CM). Additional history provided: Patient reports sharp pain/headaches in lower right portion of head and neck since February 2022. EXAM: MRI HEAD WITHOUT AND WITH CONTRAST TECHNIQUE: Multiplanar, multiecho pulse sequences of the brain and surrounding structures were obtained without and with intravenous contrast. CONTRAST:  12mL MULTIHANCE  GADOBENATE DIMEGLUMINE  529 MG/ML IV SOLN COMPARISON:  Same day MRA head and MR  cervical spine 01/09/2021. FINDINGS: Brain: Cerebral volume is normal. No cortical encephalomalacia is identified. No significant cerebral white matter disease. An apparent small focus of signal hyperintensity within the right pons on the axial T2/FLAIR sequence is not appreciated on the axial or coronal T2 T2 TSE sequences, and likely artifactual. There is no acute infarct. No evidence of an intracranial mass. No chronic intracranial blood products. No extra-axial fluid collection. No midline shift. No abnormal intracranial enhancement. Vascular: Maintained proximal arterial flow voids. Skull and upper cervical spine: No focal marrow lesion. Sinuses/Orbits: Visualized orbits show no acute finding. No significant paranasal sinus disease. Other: Small volume fluid within the left mastoid air cells. IMPRESSION: Unremarkable MRI appearance of the brain. No evidence of acute intracranial abnormality. Small left mastoid effusion. Electronically Signed   By: Rockey Childs DO   On: 01/09/2021 18:59    Assessment & Plan:  Attention and concentration deficit -     Ambulatory referral to Psychology     Follow-up: Return in about 6 months (around 08/01/2024), or if symptoms worsen or fail to improve.  Debby Molt, MD

## 2024-01-30 NOTE — Patient Instructions (Addendum)
 [facility]  [phone]   Health Maintenance, Female Adopting a healthy lifestyle and getting preventive care are important in promoting health and wellness. Ask your health care provider about: The right schedule for you to have regular tests and exams. Things you can do on your own to prevent diseases and keep yourself healthy. What should I know about diet, weight, and exercise? Eat a healthy diet  Eat a diet that includes plenty of vegetables, fruits, low-fat dairy products, and lean protein. Do not eat a lot of foods that are high in solid fats, added sugars, or sodium. Maintain a healthy weight Body mass index (BMI) is used to identify weight problems. It estimates body fat based on height and weight. Your health care provider can help determine your BMI and help you achieve or maintain a healthy weight. Get regular exercise Get regular exercise. This is one of the most important things you can do for your health. Most adults should: Exercise for at least 150 minutes each week. The exercise should increase your heart rate and make you sweat (moderate-intensity exercise). Do strengthening exercises at least twice a week. This is in addition to the moderate-intensity exercise. Spend less time sitting. Even light physical activity can be beneficial. Watch cholesterol and blood lipids Have your blood tested for lipids and cholesterol at 36 years of age, then have this test every 5 years. Have your cholesterol levels checked more often if: Your lipid or cholesterol levels are high. You are older than 36 years of age. You are at high risk for heart disease. What should I know about cancer screening? Depending on your health history and family history, you may need to have cancer screening at various ages. This may include screening for: Breast cancer. Cervical cancer. Colorectal cancer. Skin cancer. Lung cancer. What should I know about heart disease, diabetes, and high  blood pressure? Blood pressure and heart disease High blood pressure causes heart disease and increases the risk of stroke. This is more likely to develop in people who have high blood pressure readings or are overweight. Have your blood pressure checked: Every 3-5 years if you are 34-23 years of age. Every year if you are 3 years old or older. Diabetes Have regular diabetes screenings. This checks your fasting blood sugar level. Have the screening done: Once every three years after age 83 if you are at a normal weight and have a low risk for diabetes. More often and at a younger age if you are overweight or have a high risk for diabetes. What should I know about preventing infection? Hepatitis B If you have a higher risk for hepatitis B, you should be screened for this virus. Talk with your health care provider to find out if you are at risk for hepatitis B infection. Hepatitis C Testing is recommended for: Everyone born from [birth year] through 1963. Anyone with known risk factors for hepatitis C. Sexually transmitted infections (STIs) Get screened for STIs, including gonorrhea and chlamydia, if: You are sexually active and are younger than 36 years of age. You are older than 36 years of age and your health care provider tells you that you are at risk for this type of infection. Your sexual activity has changed since you were last screened, and you are at increased risk for chlamydia or gonorrhea. Ask your health care provider if you are at risk. Ask your health care provider about whether you are at high risk for HIV. Your health care provider may recommend  a prescription medicine to help prevent HIV infection. If you choose to take medicine to prevent HIV, you should first get tested for HIV. You should then be tested every 3 months for as long as you are taking the medicine. Pregnancy If you are about to stop having your period (premenopausal) and you may become pregnant, seek counseling  before you get pregnant. Take 400 to 800 micrograms (mcg) of folic acid every day if you become pregnant. Ask for birth control (contraception) if you want to prevent pregnancy. Osteoporosis and menopause Osteoporosis is a disease in which the bones lose minerals and strength with aging. This can result in bone fractures. If you are 3 years old or older, or if you are at risk for osteoporosis and fractures, ask your health care provider if you should: Be screened for bone loss. Take a calcium or vitamin D supplement to lower your risk of fractures. Be given hormone replacement therapy (HRT) to treat symptoms of menopause. Follow these instructions at home: Alcohol use Do not drink alcohol if: Your health care provider tells you not to drink. You are pregnant, may be pregnant, or are planning to become pregnant. If you drink alcohol: Limit how much you have to: 0-1 drink a day. Know how much alcohol is in your drink. In the [place] one drink equals one 12 oz bottle of beer (355 mL), one 5 oz glass of wine (148 mL), or one 1 oz glass of hard liquor (44 mL). Lifestyle Do not use any products that contain nicotine or tobacco. These products include cigarettes, chewing tobacco, and vaping devices, such as e-cigarettes. If you need help quitting, ask your health care provider. Do not use street drugs. Do not share needles. Ask your health care provider for help if you need support or information about quitting drugs. General instructions Schedule regular health, dental, and eye exams. Stay current with your vaccines. Tell your health care provider if: You often feel depressed. You have ever been abused or do not feel safe at home. Summary Adopting a healthy lifestyle and getting preventive care are important in promoting health and wellness. Follow your health care provider's instructions about healthy diet, exercising, and getting tested or screened for diseases. Follow your health care  provider's instructions on monitoring your cholesterol and blood pressure. This information is not intended to replace advice given to you by your health care provider. Make sure you discuss any questions you have with your health care provider. Document Revised: 10/13/2020 Document Reviewed: 10/13/2020 Elsevier Patient Education  2024 [employer].

## 2024-02-21 ENCOUNTER — Ambulatory Visit: Admitting: Internal Medicine

## 2024-03-09 ENCOUNTER — Ambulatory Visit (INDEPENDENT_AMBULATORY_CARE_PROVIDER_SITE_OTHER): Admitting: Psychology

## 2024-03-09 DIAGNOSIS — F89 Unspecified disorder of psychological development: Secondary | ICD-10-CM | POA: Diagnosis not present

## 2024-03-09 NOTE — Progress Notes (Addendum)
 Date: 03/09/2024 Appointment Start Time: 9:02am Duration: 102 minutes Provider: Frederic Fire, PsyD Type of Session: Initial Appointment for Evaluation  Location of Patient: Home Location of Provider: Provider's Home (private office) Type of Contact: Caregility video visit with audio  Session Content:  Prior to proceeding with today's appointment, two pieces of identifying information were obtained from [facility] to verify identity. In addition, Annaleese's physical location at the time of this appointment was obtained. In the event of technical difficulties, Nakyah shared a phone number she could be reached at. Bellamarie and this provider participated in today's telepsychological service. Breann denied anyone else being present in the room or on the virtual appointment.  The provider's role was explained to Kennetha. The provider reviewed and discussed issues of confidentiality, privacy, and limits therein (e.g., reporting obligations). In addition to verbal informed consent, written informed consent for psychological services was obtained from Jennie prior to the initial appointment. Written consent included information concerning the practice, financial arrangements, and confidentiality and patients' rights. Since the clinic is not a 24/7 crisis center, mental health emergency resources were shared, and the provider explained e-mail, voicemail, and/or other messaging systems should be utilized only for non-emergency reasons. This provider also explained that information obtained during appointments will be placed in their electronic medical record in a confidential manner. Stephan verbally acknowledged understanding of the aforementioned and agreed to use mental health emergency resources discussed if needed. Moreover, Darlina agreed information may be shared with other [facility] or their referring provider(s) as needed for coordination of care. By signing the new patient documents, Ravinder provided written consent  for coordination of care. Tamma verbally acknowledged understanding she is ultimately responsible for understanding her insurance benefits as it relates to reimbursement of telepsychological and in-person services. This provider also reviewed confidentiality, as it relates to telepsychological services, as well as the rationale for telepsychological services. This provider further explained that video should not be captured, photos should not be taken, nor should testing stimuli be copied or recorded as it would be a copyright violation. Jerrika expressed understanding of the aforementioned, and verbally consented to proceed. Remie is aware of the limitations of teleheath visits and verbally consented to proceed.  Rayyan completed the psychiatric diagnostic evaluation (history, including past, family, social, and  psychiatric history; behavioral observations; and establishment of a provisional diagnosis). The evaluation was completed in 102 minutes. Code 09208 was billed.   Mental Status Examination:  Appearance:  neat Behavior: appropriate to circumstances Mood: anxious Affect: mood congruent Speech: pressured Eye Contact: appropriate Psychomotor Activity: restless Thought Process: denies suicidal, homicidal, and self-harm ideation, plan and intent Content/Perceptual Disturbances: none Orientation: AAOx4 Cognition/Sensorium: intact Insight: good Judgment: good  Provisional DSM-5 diagnosis(es):  F89 Unspecified Disorder of Psychological Development   Plan: Testing is expected to answer the question, does the individual meet criteria for ADHD when age, other mental health concerns (e.g., anxiety, OCD, depression, and autism spectrum disorder) and cognitive functioning are taken into consideration? Further testing is warranted because a diagnosis cannot be given solely based on current interview data (further data is required). Testing results are expected to answer the remaining diagnostic  questions in order to provide an accurate diagnosis and assist in treatment planning with an expectation of improved clinical outcome. Anberlyn is currently scheduled for an appointment on 03/15/2024 at 2pm via Caregility video visit with audio.      CONFIDENTIAL PSYCHOLOGICAL EVALUATION ______________________________________________________________________________ Name: Ms. Diann Bangerter Date of Birth: October 05, 1985    Age: 38  SOURCE AND  REASON FOR REFERRAL: Ms. Gidget Quizhpi was referred by Dr. Debby Molt for an evaluation to ascertain if she meets the criteria for Attention Deficit/Hyperactivity Disorder (ADHD).    BACKGROUND INFORMATION AND PRESENTING PROBLEM: Ms. Christyana Corwin is a 38 year old female who resides in [place] .  Ms. Royal discussed how a change to a "remote position" and having gone "back to school," has led to increased insight into how having "nonstop thoughts" and "many tabs running at all times" "does not feel very natural or healthy," noting how this led to her pursuing an ADHD evaluation. She endorsed the following ADHD-related symptoms:  Symptoms Frequency   Often Occasionally Infrequent/No Significant Issues  Inattention Criterion (DSM-5-TR)     Making careless mistakes and missing small details.  She initially shared a belief that she tends to get "way too granular," which leaves her prone to becoming overwhelmed by details but that she generally does not miss details or make mistakes. 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     Being easily distracted by various stimuli and the mind often being elsewhere even when no apparent distractions exist.  She discussed that she has "mom ears" even though she "has no children," noting that she is prone to distraction by various stimuli (e.g., sounds and irrelevant  thoughts).     Trouble sustaining attention during conversations.  She also discussed that she often has trouble sustaining her attention during conversations.    Does not follow through on instructions and fails to finish tasks.   She endorsed commonly having task initiation and maintenance issues that contribute to an extended time being required to complete tasks as well as commonly completing tasks right at deadlines. However, she noted she generally will complete the tasks.    Difficulty organizing tasks and activities.  Ms. Pajak described being prone to haphazard completion of tasks, being prone to having clutter in her environment, and difficulty maintaining organization systems, noting that attentional regulation problems contribute to this symptom.     Avoids, dislikes, or is reluctant to engage in tasks that require sustained mental effort. She initially expressed a belief task initiation issues were not a significant concern, although then stated that if a task is perceived as "challenging to retain or comprehend" and/or if it has sustained attention requirements that she will regularly postpone starting it.     Loses things necessary for tasks or activities.  She stated she misplaces items "all the time."     Forgetful in daily activities.  She also stated she is prone to forgetting everyday tasks such as to return phone calls or pay bills that do not have an autopay feature.     Hyperactivity and Impulsivity Criterion (DSM-5-TR)     Fidgets with hands or feet or squirms in seat. She described habitually, and largely unconsciously, fidgeting (e.g., playing with her fingers).    Leaves seat in situations in which remaining seated is expected.   She denied experiencing significant issues with this symptom.   Feelings of restlessness. She stated "24/7" her "mind" does not "shut off," which contributes to difficulty relaxing.     Difficulty playing or engaging in leisure activities quietly. She  endorsed frequently having trouble staying quiet during leisure activities (e.g., regularly making comments during movies).    "On the go" or often acts as if "driven by a  motor." She discussed often feeling on the go, and that it "can be uncomfortable" when she is being observed while "trying to digest information someone is telling her" as it feels like it is "information overload."    Talks excessively.  She stated she regularly "can go in a million different directions" while talking, noting how restlessness and having a "million thoughts" contribute to this symptom.     Interrupts others.  She shared that she commonly interrupts others, which she attributed to concerns she will forget what she wants to say.    Impatience.  She reported she can "pretty quick[ly]" become irritable when required to wait for something she is "excited for" (e.g., a concert), and/or if someone is perceived as "going too slow."     ADHD-Related Symptoms that Assist in Identifying ADHD but are not in the DSM-5-TR Jeanna, 2021, p. 6-12 and [phone])     Emotional dysregulation and overstimulation. She discussed being "pretty quick" to irritability, feeling overstimulated or overwhelmed when multiple people are talking to her at one time or when in environments perceived as a "lot going on," becoming upset if others break her attention from a task given her difficulties with attention shifting, and that various emotions (e.g., excitement and irritability) seem to come on more strongly for her than others.    Making decisions impulsively. She described herself as impulsive, noting how she is prone to saying and purchasing things she later regrets, which can contribute to relational strains.     Tending to drive much faster than others. She described how inattention contributes to her regularly speeding (e.g., driving ten miles per hour over the speed limit), noting that others have commented on it.    Trouble following through  on promises or commitments made to others.   She described regularly following through on promises or commitments, although she shared that she places importance on doing so and that it requires a "ton of reminders" and "conversations with friends and family."   Trouble doing things in proper order or sequence. She stated she "do[es not] know what consistency looks like," and is prone to "jumping around" on tasks.      Ms. Reagor discussed having a history of one instance of depressive episode (e.g., experiencing low mood, hypersomnia, fatigue, lack of drive, and anhedonia), which she attributed to the COVID-19 pandemic, having "just recently moved" and having "no friends and family in the area;" social anxiety-related symptoms (e.g., "replaying conversations over and over and over" in her mind, regular concerns about how others are perceiving her, experiencing significant distress in social settings, and finding it hard to "open up"); obsessions and compulsions (e.g., "color organiz[ing] things" and becoming "so focused on [a task]" [e.g., "researching a computer background for hours'] that it will lead to "decision fatigue); and sleep onset and maintenance issues as well as regularly tossing and turning in her sleep, noting she "never" feels "fully rested" after waking; overeating-related behaviors, noting she will "overeat if someone lets [her]" and that she "love[s] junk food," as well as emotional eating-related behaviors (e.g., eating when stressed but not physically hungry). She also discussed having a history of autism spectrum disorder-related symptomatology (e.g., having difficulty start and maintain friendships, which she noted is partially attributable to forgetfulness; trouble starting and maintaining back and forth conversations as she "do[es not] like small talk," "do[es not know how to jump in[to the conversation]," and "do[es not] know how to respond to it;" and others indicate she has limited  emotional expressiveness, and that it "has to be something that really gets me" for her to clearly show her emotional experiences; having a high pain tolerance and a reduced showing of pain; "try[ing] really hard" to "keep order" and routine, and becoming disappointed and "freaking out" if unable; having a strong emotional reaction to various textures (e.g., sandpaper, nail filers, and Styrofoam); engaging in significant efforts to hide various social difficulties (e.g., excessive talking, interrupt of others, and struggling with small talk), which leads to fatigue, frustration, and a strong want to be [able to be herself]." She expressed a belief that her ADHD-related symptoms have occurred since childhood and are consistent and independent of mood. She described use of compensatory and coping strategies that include writing things down, "making songs" out of information, using a designated spot for, use of autopay, and reciting what someone said to assist in preventing forgetfulness; utilizing mindfulness-based meditations; letting others know her intentions; and using driving apps that alert her to the speed she is driving.  Ms. Buchberger denied awareness of having ever experienced any developmental milestone delays, grade retention, learning disability diagnosis, or having an individualized education plan. She share that schooling staff "offered" her the ability to "advanc[e] past first grade," but her mother declined to do so. 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 She reported s hie  currently in her sophomore year of college, and that she is academically "moving and grooving" in classes she is interested in or that feel applicable. She stated that in classes she has less interest in, she is more prone to forgetting the material. She denied having a significant employment disciplinary history, and described her current employment as enjoyable and "exciting."   Ms. Welling reported her medical history is unremarkable. She denied awareness of ever experiencing seizures or head injury. She reported use of psychotherapeutic services shortly after her move to [place] , noting that she did so in an effort to "not make work [her] priority." Sahe endorsed "rare" use of alcohol and one standard-sized cup of coffee a day, as well as an occasional "half" of a Celsius energy drink, although she noted "some days" she has "no caffeine." She denied use of all other recreational and illicit substances. She also denied ever experiencing psychiatric hospitalization or meeting the full criteria for hypomanic or manic episode; psychosis; trauma and stressor-related disorder; suicidal or homicidal ideation, plan, or intent; or legal involvement. She stated her familial mental health history is significant for "severe alcoholi[sm]" (grandfather); "bipolar depressive" (grandmother); suicide (aunt); unspecified substance abuse that she "just got over" (mother); possible ADHD as he is "always on the go," "rapid speed speaker," has "irritability to the max" and is "jittery," and is a "ball of energy" (father); and ADHD (maternal cousin).   Chart Review: Per an 01/30/2024 appointment note, Dr. Debby Molt reported, Ms. Ammons "present[ed] with difficulty focusing and non-restorative sleep. She has been experiencing difficulty focusing and non-restorative sleep since transitioning to remote work and returning to school in 2023. 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 She recently had to drop a class due to her inability to focus and retain information, despite being open to new learning styles. Her sleep is disturbed by persistent thoughts, leading to a lack of rejuvenation upon waking. No snoring or sleep apnea is reported, and her sleep is not affected by external disturbances such as pets. Despite sleeping, she feels as though she is not rested and describes her sleep as non-restorative. During the day, her mood is generally even, but maintaining this requires significant effort, including meditation and managing tasks in short increments to avoid burnout. She experiences frustration and disappointment, which often leads to stress eating. Despite increased eating, she has not gained weight compared to two years ago. She experiences significant decision fatigue, a topic she previously discussed with a therapist when she moved to [place]  to prevent her career from defining her life. There have been conversations about slight depression and anxiety, but she has managed these through holistic practices and has not received formal treatment. She is not currently taking any medications to enhance alertness during the day, but occasionally consumes one cup of coffee or a Celsius energy drink before evening workouts."               Frederic ONEIDA Fire, PsyD

## 2024-03-15 ENCOUNTER — Ambulatory Visit: Admitting: Psychology

## 2024-03-15 DIAGNOSIS — F89 Unspecified disorder of psychological development: Secondary | ICD-10-CM

## 2024-03-15 NOTE — Progress Notes (Signed)
 Date: 03/15/2024,    Appointment Start Time: 2pm Duration: 46 minutes Provider: Frederic Fire, PsyD Type of Session: Testing Appointment for Evaluation  Location of Patient: Home Location of Provider: Provider's Home (private office) Type of Contact: Caregility video visit with audio  Session Content: Today's appointment was a telepsychological visit. Olivia Garner is aware it is her responsibility to secure confidentiality on her end of the session. Prior to proceeding with today's appointment, Olivia Garner's physical location at the time of this appointment was obtained as well a phone number she could be reached at in the event of technical difficulties. Olivia Garner denied anyone else being present in the room or on the virtual appointment. This provider reviewed that video should not be captured, photos should not be taken, nor should testing stimuli be copied or recorded as it would be a copyright violation. Olivia Garner expressed understanding of the aforementioned, and verbally consented to proceed. The WAIS-5 was administered, scored, and interpreted by this evaluator. Olivia Garner is aware of the limitations of teleheath visits and verbally consented to proceed.  Billing codes will be input on the feedback appointment. There are no billing codes for the testing appointment.   Provisional DSM-5 diagnosis(es):  F89 Unspecified Disorder of Psychological Development   Plan: Olivia Garner was scheduled for a feedback appointment on 03/21/2024 at 10am via Caregility video visit with audio.                Frederic ONEIDA Fire, PsyD

## 2024-03-21 ENCOUNTER — Ambulatory Visit (INDEPENDENT_AMBULATORY_CARE_PROVIDER_SITE_OTHER): Admitting: Psychology

## 2024-03-21 DIAGNOSIS — F902 Attention-deficit hyperactivity disorder, combined type: Secondary | ICD-10-CM | POA: Diagnosis not present

## 2024-03-21 NOTE — Progress Notes (Signed)
 Testing and Report Writing Information: The following measures  were administered, scored, and interpreted by this provider:  Generalized Anxiety Disorder-7 (GAD-7; 5 minutes), Patient Health Questionnaire-9 (PHQ-9; 5 minutes), Wechsler Adult Intelligence Scale-Fourth Edition (WAIS-5; 70 minutes), CNS Vital Signs (45 minutes), Adult Attention Deficit/Hyperactivity Disorder Self-Report Scale Checklist (ASRSv1.1; 15 minutes), Behavior Rating Inventory for Executive Function - 2A - Self Report (BRIEF 2A; 10 minutes) and Behavior Rating Inventory for Executive Function - 2A - Informant  (BRIEF-2A; 10 minutes), Adult OCD Inventory (OCD-A) SF-20 (15 minutes), Social Responsiveness Scale, Second Edition (SRS-2; 20 minutes), and Personality Assessment Inventory (PAI; 50 minutes). A total of 235 total minutes was spent on the administration and scoring of the aforementioned measures. Codes 03863 and [phone] (7 units) were billed.  Please see the assessment for additional details. This provider completed the written report which includes integration of patient data, interpretation of standardized test results, interpretation of clinical data, review of information provided by Sherika and any collateral information/documentation, and clinical decision making (305 minutes in total).  Feedback Appointment: Date: 03/21/2024 Appointment Start Time: 10:03am Duration: 55 minutes Provider: Frederic Fire, PsyD Type of Session: Feedback Appointment for Evaluation  Location of Patient: Home Location of Provider: Provider's Home (private office) Type of Contact: Caregility video visit with audio  Session Content: Today's appointment was a telepsychological visit due to COVID-19. Saloni is aware it is her responsibility to secure confidentiality on her end of the session. She provided verbal consent to proceed with today's appointment. Prior to proceeding with today's appointment, Jordane's physical location at the time of this  appointment was obtained as well a phone number she could be reached at in the event of technical difficulties. Hydee denied anyone else being present in the room or on the virtual appointment. Gabbriella is aware of the limitations of teleheath visits and verbally consented to proceed.  This provider and Ilyssa completed the interactive feedback session which includes reviewing the aforementioned measures, treatment recommendations, and diagnostic conclusions.   The interactive feedback session was completed today and a total of 55 minutes was spent on feedback. Code 03867 was billed for feedback session.   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 03/09/2024: 2:35-2:50pm (inputting chart review information into the evaluation). 03/16/2024: 8:05-8:55am and 4-5:30pm. 03/17/2024: 8:40-9:45am. 03/18/2024: 8:30-9:40am. 03/20/2024: 9:15-9:25pm. 03/21/2024: 9:15-9:20am.   (billing code 03866 [5 units])  Plan: Gaylen provided verbal consent for her evaluation to be sent via e-mail. No further follow-up planned by this provider.         CONFIDENTIAL PSYCHOLOGICAL EVALUATION ______________________________________________________________________________ Name: Ms. Cherry Turlington Date of Birth: 11/19/1985    Age: 38 Dates of Evaluation: 03/09/2024, 03/11/2024, 03/12/2024, and 03/16/2024   SOURCE AND REASON FOR REFERRAL: Ms. Twisha Vanpelt was referred by Dr. Debby Molt for an evaluation to ascertain if she meets the criteria for Attention Deficit/Hyperactivity Disorder (ADHD).   EVALUATIVE PROCEDURES: Clinical Interview with Ms. Gaylen Perna (03/09/2024) Wechsler Adult Intelligence Scale-Fourth Edition (WAIS-5; 03/16/2024) CNS Vital Signs  (03/11/2024) Adult Attention Deficit/Hyperactivity Disorder Self-Report Scale Checklist (03/11/2024) Behavior Rating Inventory for Executive Function - A - Self Report Behavior Rating Inventory for Executive Function - 2A - Self Report (BRIEF-2A; 03/11/2024) and Informant (03/12/2024) Personality Assessment Inventory (PAI; 03/11/2024) Patient Health Questionnaire-9 (PHQ-9) Generalized Anxiety Disorder-7 (GAD-7) Adult OCD Inventory (OCD-A) SF-20 (03/11/2024) Social Responsiveness Scale,  Second Edition (SRS-2; 03/09/2024)   BACKGROUND INFORMATION AND PRESENTING PROBLEM: Ms. Soriah Leeman is a 38 year old female who resides in [place] .  Ms. Tolleson discussed a change to a "remote position" and having gone "back to school" has led to increased insight into how having "nonstop thoughts" and "many tabs running at all times does not feel very natural or healthy," noting this led to her pursuing an ADHD evaluation. She endorsed the following ADHD-related symptoms:  Symptoms Frequency   Often Occasionally Infrequent/No Significant Issues  Inattention Criterion (DSM-5-TR)     Making careless mistakes and missing small details.  She initially shared a belief that she tends to get "way too granular," which leaves her prone to becoming overwhelmed by details but that she generally does not miss details or make mistakes. 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     Being easily distracted by various stimuli and the mind often being elsewhere even when no apparent distractions exist.  She discussed that she has "mom ears" even though she "has no children," noting that she is prone to distraction by various stimuli (e.g., sounds and irrelevant thoughts).     Trouble sustaining attention during conversations.  She also discussed that she often has trouble  sustaining her attention during conversations.    Does not follow through on instructions and fails to finish tasks.   She endorsed commonly having task initiation and maintenance issues that contribute to an extended time being required to complete tasks as well as commonly completing tasks right at deadlines. However, she noted she generally will complete the tasks.    Difficulty organizing tasks and activities.  Ms. Mireles described being prone to haphazard completion of tasks, being prone to having clutter in her environment, and difficulty maintaining organization systems, noting that attentional regulation problems contribute to this symptom.     Avoids, dislikes, or is reluctant to engage in tasks that require sustained mental effort. She initially expressed a belief task initiation issues were not a significant concern, although then stated that if a task is perceived as "challenging to retain or comprehend" and/or if it has sustained attention requirements that she will regularly postpone starting it.     Loses things necessary for tasks or activities.  She stated she misplaces items "all the time."     Forgetful in daily activities.  She also stated she is prone to forgetting everyday tasks such as to return phone calls or pay bills that do not have an autopay feature.     Hyperactivity and Impulsivity Criterion (DSM-5-TR)     Fidgets with hands or feet or squirms in seat. She described habitually, and largely unconsciously, fidgeting (e.g., playing with her fingers).    Leaves seat in situations in which remaining seated is expected.   She denied experiencing significant issues with this symptom.   Feelings of restlessness. She stated "24/7" her "mind" does not "shut off," which contributes to difficulty relaxing.     Difficulty playing or engaging in leisure activities quietly. She endorsed frequently having trouble staying quiet during leisure activities (e.g., regularly making comments  during movies).    "On the go" or often acts as if "driven by a motor." She discussed often feeling on the go, and that it "can be uncomfortable" when she is being observed while "trying to digest information someone is  telling her," as it feels like it is "information overload."    Talks excessively.  She stated she regularly "can go in a million different directions" while talking, noting restlessness and having a "million thoughts" contribute to this symptom.     Interrupts others.  She shared that she commonly interrupts others, which she attributed to concerns she will forget what she wants to say.    Impatience.  She reported she can "pretty quick[ly]" become irritable when required to wait for something she is "excited for" (e.g., a concert), and/or if someone is perceived as "going too slow."     ADHD-Related Symptoms that Assist in Identifying ADHD but are not in the DSM-5-TR Jeanna, 2021, p. 6-12 and [phone])     Emotional dysregulation and overstimulation. She discussed being "pretty quick" to irritability, feeling overstimulated or overwhelmed when multiple people are talking to her at one time or when in environments perceived as a "lot going on," becoming upset if others break her attention from a task given her difficulties with attention shifting, and that various emotions (e.g., excitement and irritability) seem to come on more strongly for her than others.    Making decisions impulsively. She described herself as impulsive, noting she is prone to saying and purchasing things she later regrets, which can contribute to relational strains.     Tending to drive much faster than others. She described inattention contributes to her regularly speeding (e.g., driving ten miles per hour over the speed limit), noting that others have commented on it.    Trouble following through on promises or commitments made to others.   She described regularly following through on promises or commitments,  although she shared that she places importance on doing so and that it requires a "ton of reminders" and "conversations with friends and family."   Trouble doing things in proper order or sequence. She stated she "do[es not] know what consistency looks like," and is prone to "jumping around" on tasks.      Ms. Bellin discussed having a history of one instance of depressive episode (e.g., experiencing low mood, hypersomnia, fatigue, lack of drive, and anhedonia), which she attributed to the COVID-19 pandemic, having "just recently moved" and having "no friends and family in the area;" social anxiety-related symptoms (e.g., "replaying conversations over and over and over" in her mind, regular concerns about how others are perceiving her, experiencing significant distress in social settings, and finding it hard to "open up"); obsessions and compulsions (e.g., "color organiz[ing] things" and becoming "so focused on [a task]" [e.g., "researching a computer background for hours'] that it will lead to "decision fatigue"); and sleep onset and maintenance issues as well as regularly tossing and turning in her sleep, noting she "never" feels "fully rested" after waking; overeating-related behaviors, noting she will "overeat if someone lets [her]" and that she "love[s] junk food," as well as emotional eating-related behaviors (e.g., eating when stressed but not physically hungry). She also discussed having a history of autism spectrum disorder-related symptomatology, that include having difficulty start and maintain friendships, which she noted is partially attributable to forgetfulness; trouble starting and maintaining reciprocal conversations as she "do[es not] like small talk," "do[es not know how to jump in[to the conversation]," and "do[es not] know how to respond to it;" others having indicated she has limited emotional expressiveness, and that it "has to be something that really gets [her]" for her to clearly show  her emotional experiences; having a high pain tolerance and a reduced showing  of pain; "try[ing] really hard" to "keep order" and routine, and becoming disappointed and "freaking out" if unable; having a strong emotional reaction to various textures (e.g., sandpaper, nail filers, and Styrofoam); and engaging in significant efforts to hide various social difficulties (e.g., excessive talking, interrupting others, and struggling with small talk), which leads to fatigue, frustration, and a strong want to be [able to be herself]." She expressed a belief that her ADHD-related symptoms have occurred since childhood and are consistent and independent of mood. She described the use of compensatory and coping strategies that include writing things down, "making songs" out of information, using a designated spot for items, using autopay, reciting what someone said to assist in preventing forgetfulness, utilizing mindfulness-based meditations, letting others know her intentions, and using driving apps that alert her to the speed she is driving.  Ms. Ganim denied awareness of having ever experienced any developmental milestone delays, grade retention, learning disability diagnosis, or having an individualized education plan. She shared that schooling staff "offered" her the ability to "advanc[e] past first grade," but her mother declined to do so. She discussed she went to a "magnet school" for elementary school, and that her primary and secondary schooling was "focused on the arts." She further shared that she was an "okay student" but was "really involved in the arts, adding she "struggled" in mathematics classes as she "d[id] not understand it." She noted "incentives" for completing homework assisted her in "mak[ing] sure [she] did so." She denied having significant behavioral issues in school. She reported she is currently in her sophomore year of college, and that she is academically "moving and grooving" in classes  she is interested in or that feel applicable. She stated that in classes she finds less interesting, she is more prone to forgetting the material. She denied having a significant employment disciplinary history and described her current employment as enjoyable and "exciting."   Ms. Nellums reported that her medical history is unremarkable. She denied awareness of ever experiencing seizures or a head injury. She reported use of psychotherapeutic services shortly after her move to [place] , noting that she did so in an effort to "not make work [her] priority." She endorsed "rare" use of alcohol and one standard-sized cup of coffee a day, as well as an occasional "half" of a Celsius energy drink, although she noted "some days" she has "no caffeine." She denied use of all other recreational and illicit substances. She also denied ever experiencing psychiatric hospitalization or meeting the full criteria for hypomanic or manic episode; psychosis; trauma and stressor-related disorder; suicidal or homicidal ideation, plan, or intent; or legal involvement. She stated her familial mental health history is significant for "severe alcoholi[sm]" (grandfather); "bipolar depressive" (grandmother); suicide (aunt); unspecified substance abuse that she "just got over" (mother); possible ADHD as he is "always on the go," "rapid speed speaker," has "irritability to the max" and is "jittery," and is a "ball of energy" (father); and ADHD (maternal cousin).   Chart Review: Per an 01/30/2024 appointment note, Dr. Debby Molt reported, Ms. Kepple "present[ed] with difficulty focusing and non-restorative sleep. She has been experiencing difficulty focusing and non-restorative sleep since transitioning to remote work and returning to school in 2023. 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 She recently had to drop a class due to  her inability to focus and retain information, despite being open to new learning styles. Her sleep is  disturbed by persistent thoughts, leading to a lack of rejuvenation upon waking. No snoring or sleep apnea is reported, and her sleep is not affected by external disturbances such as pets. Despite sleeping, she feels as though she is not rested and describes her sleep as non-restorative. During the day, her mood is generally even, but maintaining this requires significant effort, including meditation and managing tasks in short increments to avoid burnout. She experiences frustration and disappointment, which often leads to stress eating. Despite increased eating, she has not gained weight compared to two years ago. She experiences significant decision fatigue, a topic she previously discussed with a therapist when she moved to [place]  to prevent her career from defining her life. There have been conversations about slight depression and anxiety, but she has managed these through holistic practices and has not received formal treatment. She is not currently taking any medications to enhance alertness during the day, but occasionally consumes one cup of coffee or a Celsius energy drink before evening workouts."   BEHAVIORAL OBSERVATIONS: Ms. Lamartina presented on time for the evaluation. She was well-groomed. She was oriented to the time, place, person, and purpose of the appointment. During the evaluation, Ms. Kirchman verbalized and/or demonstrated self-expression difficulties (e.g., stating, "How do I say [the similarities between two words]" and seemingly voicing her thoughts out loud as she attempted to determine a best answer), working memory-related problems (e.g., noting she was "try[ing] to visualize the numbers" but "sometimes [the verbally provided digits] would go away," and stating answers to Working Memory Index subtest items quickly which she noted was secondary to "if [the digits] were not said  in three seconds it is like they disappear"), and impulsivity (e.g., having abrupt facial reactions upon hearing subtest instructions and/or test items). Throughout the evaluation, she maintained appropriate eye contact. Her thought processes and content were logical, coherent, and goal-directed. There were no overt signs of a thought disorder or perceptual disturbances, nor did she report such symptomatology. There was no evidence of paraphasias (i.e., errors in speech, gross mispronunciations, and word substitutions), repetition deficits, or disturbances in volume or prosody (i.e., rhythm and intonation). Overall, based on Ms. Bertsch's approach to testing, the current results are believed to be a fair estimate of her abilities.  PROCEDURAL CONSIDERATIONS: Psychological testing measures were conducted through a virtual visit with video and audio capabilities, but otherwise in a standard manner.   The Wechsler Adult Intelligence Scale, Fifth Edition (WAIS-5) was administered via remote telepractice using digital stimulus materials on [employer]'s Q-global system. The remote testing environment appeared free of distractions, adequate rapport was established with the examinee via video/audio capabilities, and Ms. Guarino appeared appropriately engaged in the task throughout the session. No significant technological problems or distractions were noted during administration. Modifications to the standardization procedure included: none. The WAIS-5 subtests, or similar tasks, have received initial validation in several samples for remote telepractice and digital format administration, and the results are considered a valid description of Ms. Hodgens's skills and abilities.  CLINICAL FINDINGS:  COGNITIVE FUNCTIONING  Wechsler Adult Intelligence Scale, Fourth Edition (WAIS-5): Ms. Fleet completed subtests of the WAIS-5, a full-scale measure of cognitive ability. The WAIS-5 comprises four indices that measure  cognitive processes that are components of intellectual ability; however, only subtests from the Verbal Comprehension and Working Memory indices were administered. As a result, Full-Scale-IQ (FSIQ) and General Ability Index (GAI) could not be determined.   WAIS-5 Scale/Subtest Composite Score/Scaled Score 95% Confidence Interval Percentile Rank Qualitative Description  Verbal Comprehension (VCI) 105 97-112 63 Average  Similarities 11     Vocabulary 11     Working Memory (WMI) 103 96-110 58 Average  Digit Sequencing  10     Running Digits 11     Digits Forward 10       The Verbal Comprehension Index (VCI) measures one's ability to receive, comprehend, and express language. It also measures the ability to retrieve previously learned information and to understand relationships between words and concepts presented orally. Ms. Carreto obtained a VCI score of 105 (63rd percentile), placing her in the average range compared to same-aged peers. Her performance was comparable on the subtests.   The Working Memory Index (WMI) measures one's ability to sustain attention, concentrate, and exert mental control. Ms. Ramdass obtained a WMI score of 103 (58th percentile), placing her in the average range compared to same-aged peers. Her performance on the subtests was comparable.   ATTENTION AND PROCESSING  CNS Vital Signs: The CNS Vital Signs assessment evaluates the neurocognitive status of an individual and covers a range of mental processes. The results of the CNS Vital Signs testing indicated low average neurocognitive processing ability. Attentional abilities ranged from very low to average, with simple attention and complex attention in the very low range, and sustained attention in the average range. Executive function was low, and cognitive flexibility was very low. Working memory was average. Psychomotor speed, motor speed, processing speed, and reaction time ranged from average to above, which suggests  average to above hand-eye coordination, thinking speed, and responsiveness. Visual memory (images) was low average, and verbal memory (words) was average, indicating that verbal memory is a relative strength. The results suggest Ms. Larrick experiences impairment in complex attention, simple attention, cognitive flexibility, and executive function; weakness in visual memory; and a strength in reaction time. Upon follow-up, Ms. Califano shared how these results relate to the cognitive difficulties she experiences in daily life and expressed a belief that her focus on the testing made it easier to sustain her attention than in daily life.   Domain  Standard Score Percentile Validity Indicator Guideline  Neurocognitive Index 87 19 Yes Low Average  Composite Memory 95 37 Yes Average  Verbal Memory 103 58 Yes Average  Visual Memory 89 23 Yes Low Average  Psychomotor Speed 104 61 Yes Average  Reaction Time 112 79 Yes Above  Complex Attention 58 1 Yes Very Low  Cognitive Flexibility 67 1 Yes Very Low  Processing Speed  98 45 Yes Average  Executive Function 75 5 Yes Low  Working Memory 93 32 Yes Average  Sustained Attention 102 55 Yes Average  Simple Attention 44 1 Yes Very Low  Motor Speed 106 66 Yes Average   EXECUTIVE FUNCTION Behavior Rating Inventory of Executive Function, Second Edition Event organiser) Self-Report: Ms. Bolen completed the Self-Report Form of the Behavior Rating Inventory of Executive Function-Adult Version, Second Edition [employer]), which has three domains that evaluate cognitive, behavioral, and emotional regulation, and a Global Executive Composite score provides an overall snapshot of executive functioning. There are no missing item responses in the protocol. The Negativity, Infrequency, and Inconsistency scales are not elevated, suggesting she did not respond to the protocol in an overly negative, haphazard, extreme, or inconsistent manner. In the context of these validity  considerations, ratings of Ms. Kaplan's everyday executive function suggest some areas of concern. The overall index score, the GEC, was mildly elevated (GEC T = 68, %ile = 96). The Behavior Regulation Index (BRI) score was  within normal limits (BRI T = 61, %ile = 91), but the Emotion Regulation Index (ERI) score was mildly elevated (ERI T = 66, %ile = 92), and the Cognitive Regulation Index (CRI) score was mildly elevated (CRI T = 69, %ile = 96). Ms. 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 She did not describe her ability to resist impulses, be aware of her functioning in social settings, react to events appropriately, get going on tasks and activities and independently generate ideas, plan and organize her approach to problem solving appropriately, and be appropriately cautious in her approach to tasks and check for mistakes as problematic. However, the Inhibit, Initiate, Plan/Organize, and Task Monitor scales approached an abnormal elevation.  Scale/Index  Raw Score T Score Percentile Qualitative Description  Inhibit 14 64 93 Approaching an Elevation  Self-Monitor 9 57 82 Within Normal Limits  Behavior Regulation Index (BRI) 23 61 91 Approaching an Elevation  Shift 14 73 99 Moderately Elevated  Emotional Control 13 57 80 Within Normal Limits  Emotion Regulation Index (ERI) 27 66 92 Mildly Elevated  Initiate 15 62 91 Approaching an Elevation  Working Memory 18 75 99 Highly Elevated  Plan/Organize 14 60 86 Approaching an Elevation  Task Monitor 11 62 90 Approaching an Elevation  Organization of Materials 19 73 99 Moderately Elevated  Cognitive Regulation Index (CRI) 77 69 96 Mildly Elevated  Global Executive Composite (GEC) 127 68 96 Mildly Elevated   Validity Scale Raw Score Cumulative Percentile Protocol Classification  Inconsistency 3 98 Acceptable  Negativity 2 98 Acceptable   Infrequency 1 98 Acceptable   Behavior Rating Inventory of Executive Function, Second Edition Event organiser) Informant:  Ms. Fredricksen's friend, Ms. Esaw Piety, completed the Informant Form of the Behavior Rating Inventory of Executive Function-Adult Version, Second Edition [employer]), which is equivalent to the Self-Report version and has three domains that evaluate cognitive, behavioral, and emotional regulation, and a Global Executive Composite score provides an overall snapshot of executive functioning. There are no missing item responses in the protocol. The Negativity, Infrequency, and Inconsistency scales are not elevated, suggesting she did not respond to the protocol in an overly negative, haphazard, extreme, or inconsistent manner. In the context of these validity considerations, Ms. Anderson's ratings of Ms. Wilborn's everyday executive function suggest some areas of concern. The overall index score, the GEC, was mildly elevated (GEC T = 67, %ile = 91). The Emotion Regulation Index (ERI) score was within normal limits (ERI T = 56, %ile = 79), but the Behavior Regulation Index (BRI) score was mildly elevated (BRI T = 65, %ile = 92), and the Cognitive Regulation Index (CRI) score was mildly elevated (CRI T = 68, %ile = 94). Ms. Piety indicated that Ms. Fahey experiences difficulty in adjusting well to changes, sustaining working memory, Gaffer and organizing her approach to problem-solving, and being appropriately cautious in her approach to tasks and checking for mistakes. She did not describe Ms. Anderson's ability to resist impulses, be aware of her functioning in social settings, react to events appropriately, get going on tasks and activities and independently generate ideas, and keep materials and belongings reasonably well-organized as problematic. However, the Self-Monitor and Organization of Materials scales are approaching an elevation.  Scale/Index  Raw Score T Score Percentile  Qualitative Description  Inhibit 13 59 88 Within Normal Limits   Self-Monitor 12 63 96 Approaching an Elevation  Behavior Regulation Index (BRI) 25 65 92 Mildly Elevated  Shift  13 66 99 Mildly Elevated  Emotional Control 9 46 51 Within Normal Limits   Emotion Regulation Index (ERI) 22 56 79 Within Normal Limits   Initiate 13 58 86 Within Normal Limits   Working Memory 16 68 97 Mildly Elevated  Plan/Organize 17 70 97 Moderately Elevated  Task-Monitor 13 66 >99 Mildly Elevated  Organization of Materials 16 61 88 Approaching an Elevation  Cognitive Regulation Index (CRI) 75 68 94 Mildly Elevated  Global Executive Composite (GEC) 122 67 91 Mildly Elevated   Validity Scale Raw Score Cumulative Percentile Protocol Classification  Inconsistency 3 99 Acceptable  Negativity 0 98 Acceptable  Infrequency 0 98 Acceptable   BEHAVIORAL FUNCTIONING   Patient Health Questionnaire-9 (PHQ-9): Ms. Shedlock completed the PHQ-9, a self-report measure that assesses symptoms of depression. She scored 4/27, which indicates minimal depression.   Generalized Anxiety Disorder-7 (GAD-7): Ms. Mossberg completed the GAD-7, a self-report measure that assesses symptoms of anxiety. She scored 11/21, which indicates moderate anxiety.   Adult ADHD Self-Report Scale Symptom Checklist (ASRS): Ms. Ki reported the following symptoms as sometimes: difficulty getting things in order when a task requires organization, making careless mistakes when working on boring or complex projects, feeling restless or fidgety, and talking too much in social situations. She endorsed the following symptoms as occurring often: avoiding or delaying getting started on tasks requiring a lot of thought and difficulty relaxing. She endorsed the following symptoms as very often: difficulty wrapping up the final details of a project following the completion of challenging aspects, problems remembering appointments or obligations, fidgeting or  squirming, feeling overly active and compelled to do things, struggling to sustain attention when doing boring or repetitive work, struggling to concentrate on what people say even when they are speaking directly to her, misplacing or having difficulty finding things, being distracted by the noise around her, interrupting others or finishing their sentences, difficulty waiting for her turn in turn-taking situations, and interrupting others when they are busy. The endorsement of at least four items in Part A is highly consistent with ADHD in adults. The frequency scores of Part B provide additional cues. Ms. Purk scored a 6/6 on Part A and 8/12 on Part B, which is considered a positive screening for ADHD.   Adult OCD Inventory (OCD-A) SF-20: The OCD-A SF-20 was administered. Ms. Puller scored 120/300, which is indicative of mild problems with OCD-related concerns. She endorsed the following as "Yes, this stops me a little or wastes a little of my time": spending more time than needed to check their work, having a special number, getting angry if other people mess up their desk or work area, liking to eat the same foods, feeling guilty over minor infractions, having a special number they count up to or do things just that number of times, and feeling compelled to do things they don't really want to do. She endorsed the following as "Yes, this stops me from doing other things or wastes some of my time": having to put things away just right, counting things or having numbers frequently go through her mind, and moving or talking in special ways to avoid bad luck. She endorsed the following as "Yes, this stops me from doing a lot of things and wastes a lot of my time": worrying about being clean, trouble making up her mind, and having thoughts or words repeat themselves over and over in her mind.  Social Responsiveness Scale-2 (SRS-2): The SRS-2 is a self-report measure that identifies and examines the  severity of  social impairment commonly associated with Autism Spectrum Disorder (ASD). Ms. Ulloa indicated she does not experience significant social communication and interaction problems (SCI T = 59), such as identifying (AWR T = 44) and interpreting (COG T = 42) social cues. She also denied having severely restricted interests and engagement in repetitive behaviors (RRB T = 55). However, she indicated mild difficulty engaging in expressive social communication (COM T = 66) as well as low motivation to engage in social-interpersonal behavior (MOT T = 67). Her results are not suggestive of her endorsing many of the components of autism spectrum disorder. As such, the social communications difficulties endorsed on this measure may be better explained by her reported social anxiety-related symptoms.   Personality Assessment Inventory (PAI): The PAI is an objective inventory of adult personality. The validity indicators suggested Ms. Profeta's profile is interpretable (ICN T = 49, INF T = 55, NIM T = 55, and PIM T = 57). She is endorsing impatience and being easily frustrated to an extent that may cause relational stress (MAN-I T = 78 and PAR-P T = 60), as well as an activity level that is noticeably high even to a casual observer (MAN-A T = 76); changes in routine, unexpected, events, and contradictory information are likely to generate significant stress (ARD-O T = 65); vegetative signs of depression (e.g., sleep problems, appetite changes, and/or fatigue; DEP-P T = 67);  a loosening of associations and increased difficulties in self-expression (SCZ-T T = 81); and craving stimulation that may lead to impulsive acts in an effort to stir up excitement (ANT-S T = 67). She also endorsed being hard to get to know well and somewhat distant in personal relationships (PAR-H T = 60 and WRM T = 38) and having few close interpersonal relationships or possible dissatisfaction with the nature of these relationships (NON T = 64). She  appears to be self-assured (DOM T = 67), as well as being generally satisfied with herself and seeing little need for significant changes in behavior (RXR T = 59).     SUMMARY AND CLINICAL IMPRESSIONS: Ms. Renzulli is a 38 year old female whom Dr. Debby Molt referred for an evaluation to determine if she currently meets criteria for a diagnosis of Attention-Deficit/Hyperactivity Disorder (ADHD).   Ms. Pareja reported that an employment change to a "remote position" and restarting schooling have led to increased insight into various ADHD-related symptoms and difficulties. She expressed a belief that her ADHD-related symptoms have occurred since childhood and are consistent and independent of mood.  Ms. Sahlin was administered assessments during the evaluation to measure her current cognitive abilities. Her verbal comprehension abilities were in the average range and comparably developed. Her ability to sustain attention, concentrate, and exert mental control was also in the average range and comparably developed. Results of the CNS Vital Signs indicated a low average neurocognitive processing ability, with impairment in complex attention, simple attention, cognitive flexibility, and executive function; weakness in visual memory; and a strength in reaction time.     During the clinical interview and on self-report measures, Ms. Sherry endorsed executive functioning concerns that include attentional dysregulation, hyperactivity- and impulsivity-related symptoms, and meeting the full criteria for ADHD. Moreover, she and her friend, Ms. Esaw Piety, endorsed a perception that she is experiencing several significant executive functioning issues. 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 The specifier of "Mild" was given as she endorsed symptoms that are causing modest impairment or difficulties in her academic (e.g., she is more prone to forgetting the material of classes she is less interested in), social (e.g., frequently engaging in excessive talking and interrupting of others), and daily (e.g., regularly experiencing being easily distracted, disorganization, and forgetfulness) functioning.   Ms. Oatley discussed a history of depressive episode that she attributed to the COVID-19 pandemic and having "just recently moved" and having "no friends and family in the area;" social anxiety-related symptoms; obsessions and compulsions; sleep onset and maintenance issues as well as regularly tossing and turning in her sleep and not feeling "fully rested" after waking; overeating-related behaviors and emotional eating-related behaviors; and autism spectrum disorder-related symptomatology. As such, the PHQ-9, GAD-7, OCD-A, SRS-2, and PAI were administered. Her results suggest she experiences minimal depression-related symptomatology, moderate anxiety-related symptomatology, mild problems with OCD-related concerns, and mild difficulty with expressive social communication and low motivation to engage in interpersonal behavior. Given the limited scope of this evaluation, it was not possible to determine if the full criteria for depressive disorder, anxiety disorder, OCD, and sleep-wake disorder are met, or if her diagnosis of ADHD better explains the symptoms. However, her SRS-2 results are not suggestive of her endorsing many of the components of autism spectrum disorder. As such, the endorsed difficulties on the SRS-2 may be better explained by other endorsed mental health concerns (e.g., ADHD and/or social anxiety). Thus, she would likely benefit from further evaluation of these symptoms to definitively rule in or out the disorders above. Should any of the  aforementioned be ruled in, they would likely be in addition to her diagnosis of ADHD, as she described her ADHD-related concerns as occurring before some of the concerns, as consistent across situations, and independent of mood. Moreover, she endorsed ADHD-related symptoms (e.g., being distracted by various stimuli [versus primarily internal], regularly engaging in excessive talking and interrupting others, and impulsivity).   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 Feliz would likely benefit from a consultation regarding medication for ADHD symptoms.   Individual therapeutic services may assist in processing a diagnosis of ADHD and discussing coping and compensatory strategies. Ms. Shed would likely benefit from making use of strategies for ADHD symptoms:  Setting a timer to complete tasks. Break tasks into manageable chunks and spread them out over more extended periods with breaks.  Utilizing lists and day calendars to keep track of tasks.  Answering emails daily.  Improve listening skills by asking the speaker to give information in smaller chunks and asking for explanations and clarification as needed. Leaving more than the anticipated time to complete tasks. It may help to keep tasks brief, well within your attention span, and a mix of both high and low-interest tasks. Tasks may be gradually increased in length. Practice proactive planning by setting aside time every evening to plan for the next day (e.g., prepare needed materials or pack the car the night before).  Learn how to make a practical and reasonable "to-do" list of important tasks and priorities and always keep it easily accessible. Make additional copies in case it is lost or misplaced. Utilize visual reminders by posting appointments, "to-do lists," or schedules in strategic areas at home and work.  Practice using an appointment book,  smartphone, or other tech device, or a daily planning calendar, and learn to write down appointments and commitments immediately. Keep notepads or use  a portable audio recorder to capture important ideas that would be beneficial to recall later. Learn and practice time management skills. Purchase a programmable alarm watch or set an alarm on a smartphone to avoid losing track of time.  Use a color-coded file system, desk and closet organizers, storage boxes, or other organization devices to reduce clutter and improve efficiency and structure.  Implement ways to become more aware of your actions and to inhibit or adjust them as warranted (e.g., reviewing videos of your actions, considering consequences of obeying or not obeying the rules of various upcoming situations, having a trusted other to discuss plans with, and/or provide cues to stop certain behaviors, and make visual cues for rules you would like to follow). The 4Rs: Read just one paragraph, recite out loud in a soft voice or whisper what was important in that material, write that material down in a notebook, then review what you just wrote. Stay flexible and be prepared to change your plans, as breakthroughs in symptoms and crises will likely occur periodically. Ms. Wren would likely benefit from ongoing use of mindfulness-based exercises to address symptoms of inattention.  Mental alertness/energy can be raised by increasing exercise; improving sleep; eating a healthy diet; and managing stress. Consulting with a physician regarding any changes to the physical regimen is recommended. "Failing at Normal: An ADHD Success Story" by Harlene Solar is a great overview of ADHD. Dr. Nelwyn Pica also has a YouTube channel called, "Nelwyn Pica, PhD - Dedicated to ADHD Science+" with helpful videos on ADHD-related topics: [email or web address]  Applications:  RescueTime. Tracks your activities on your phone and/or computer  to determine how productive you have been and what distracted you. Free two-week trial.  [email or web address] . It uses engineered audio that may reduce distractions and assist with focus. Free 15-day trial. Freedom. Allows you to highlight days and times you want to block yourself from certain sites or apps. Free trial. Merrily.  It allows you to input your bank accounts and creates a visual layout of information about your financial goals, budget management, alerts, etc. May offer a free trial. Boomerang. It allows you to schedule times an email is sent and to see if others have received or opened your email. Ten messages free per month and a free trial of the premium version. IFTTT. Uses "channels" to create various actions (e.g., if you are mentioned in an email, highlight it in your inbox, and if you miss a call, add it to a to-do list). Free and premium versions. Unroll.me. Cleans up your email by unsubscribing from what you do not want to receive while still getting everything you do. Free. Finish. It allows you to divide two-list tasks into short-term, mid-term, and long-term tasks and determine how much time is left for a task. Focus mode hides non-priority tasks.  Autosilent. Turns your phone ringer on and off based on specified calendars, geo-fences, timers, etc. $3.99. Freakyalarm. It makes you solve math problems to disable an alarm. $1.99. Wake N Shake. It makes you vigorously shake your phone to stop the alarm. $.99. Todoist. It allows you to add sub-tasks to tasks and includes email and web plugins to make it work across the system. Premium has location-based reminders, calendar sync, productive tracking, etc.  Sleep Cycle. Utilize your phone's motion sensors to catch movement while you are asleep. The alarm will wake you as early as 30 minutes before your alarm based on your lightest sleep phase and show you how daily activities affect your sleep quality.  Books: "Taking Charge of Adult ADHD  Second Edition" by Dr. Nelwyn Pica "The ADHD Effect on Marriage" by Eleanor Bowers "The Couples Guide to Thriving with ADHD" by Melissa Orlov "Survival Guide for College Students with ADHD and LD" by Dr. Rollo Fess Organizations that are a reliable source of information on ADHD:  Children and Adults with Attention-Deficit/Hyperactivity Disorder (CHADD): chadd.org  Attention Deficit Disorder Association (ADDA): HotterNames.de ADD Resources: addresources.org ADD WareHouse: addwarehouse.com World Federation of ADHD: adhd-federation.org ADDConsults: addconsults.com Compilation of ADHD resources: https://www.harrell.com/ Future evaluation, if deemed necessary, and/or to determine the effectiveness of recommended interventions.   Frederic Fire, Psy.D. Licensed Psychologist - [facility] [phone]   References  Pica, R. A. (2021). Taking charge of adult ADHD: proven strategies to succeed at work, at   home, and in relationships (pp. 6-10 and 272-276). [employer].             Frederic ONEIDA Fire, PsyD

## 2024-04-09 ENCOUNTER — Encounter: Payer: Self-pay | Admitting: Internal Medicine

## 2024-04-09 ENCOUNTER — Ambulatory Visit (INDEPENDENT_AMBULATORY_CARE_PROVIDER_SITE_OTHER): Admitting: Internal Medicine

## 2024-04-09 VITALS — BP 108/76 | HR 88 | Temp 99.0°F | Resp 16 | Ht 62.0 in | Wt 131.2 lb

## 2024-04-09 DIAGNOSIS — F900 Attention-deficit hyperactivity disorder, predominantly inattentive type: Secondary | ICD-10-CM | POA: Diagnosis not present

## 2024-04-09 DIAGNOSIS — Z23 Encounter for immunization: Secondary | ICD-10-CM

## 2024-04-09 MED ORDER — COVID-19 MRNA VAC-TRIS(PFIZER) 30 MCG/0.3ML IM SUSY: 0.3000 mL | PREFILLED_SYRINGE | Freq: Once | INTRAMUSCULAR | 0 refills | Status: AC

## 2024-04-09 MED ORDER — LISDEXAMFETAMINE DIMESYLATE 20 MG PO CAPS: 20.0000 mg | ORAL_CAPSULE | Freq: Every day | ORAL | 0 refills | Status: AC

## 2024-04-09 NOTE — Patient Instructions (Signed)

## 2024-04-09 NOTE — Progress Notes (Unsigned)
 Subjective:  Patient ID: Olivia Garner, female    DOB: 05/13/1986  Age: 38 y.o. MRN: [id number]  CC: ADHD (Discussing the next steps to her diagnosis. Possibly trying medications and positive impact to quality of life. )   HPI Olivia Garner presents for f/up ---  Discussed the use of AI scribe software for clinical note transcription with the patient, who gave verbal consent to proceed.  History of Present Illness Olivia Garner is a 38 year old female who presents for evaluation and management of ADHD symptoms.  She has undergone virtual ADHD testing with a psychologist named Caron, which was thorough and provided holistic suggestions. She is interested in discussing medication options for ADHD as this is her first time considering medication outside of birth control.  ADHD symptoms are affecting her job, academic, and social performance. She experiences difficulty with focus and attention, impacting her ability to retain information in school and stay on task in her remote work setting. She has been working at her job for ten years and is currently pursuing a bachelor's degree, which she finds challenging due to retention issues.  The patient reports that the psychologist did not find any symptoms of sleep disorder, depression, or anxiety during her evaluation. Her last menstrual cycle was three weeks ago, and she is due in a week. [facility] uses [facility] on [address] for her prescriptions.  She mentions a family history where her mother and grandmother did not have conversations about medication, leading to increased medication use over time.     Outpatient Medications Prior to Visit  Medication Sig Dispense Refill   norgestimate-ethinyl estradiol (ORTHO-CYCLEN) 0.25-35 MG-MCG tablet Sprintec (28) 0.25 mg-35 mcg tablet     No facility-administered medications prior to visit.    ROS Review of Systems  Constitutional: Negative.  Negative for appetite  change, diaphoresis, fatigue and unexpected weight change.  HENT: Negative.    Eyes: Negative.   Respiratory:  Negative for cough, chest tightness, shortness of breath and wheezing.   Cardiovascular:  Negative for chest pain, palpitations and leg swelling.  Gastrointestinal: Negative.  Negative for abdominal pain, constipation, diarrhea, nausea and vomiting.  Endocrine: Negative.   Genitourinary: Negative.  Negative for difficulty urinating.  Musculoskeletal: Negative.   Skin: Negative.   Neurological: Negative.  Negative for dizziness.  Hematological:  Negative for adenopathy. Does not bruise/bleed easily.  Psychiatric/Behavioral:  Positive for decreased concentration. Negative for dysphoric mood and self-injury. The patient is not nervous/anxious.     Objective:  BP 108/76 (BP Location: Left Arm, Patient Position: Sitting, Cuff Size: Normal)   Pulse 88   Temp 99 F (37.2 C) (Oral)   Resp 16   Ht 5' 2 (1.575 m)   Wt 131 lb 3.2 oz (59.5 kg)   LMP 03/20/2024   SpO2 99%   BMI 24.00 kg/m   BP Readings from Last 3 Encounters:  04/09/24 108/76  01/30/24 120/84  01/04/22 104/66    Wt Readings from Last 3 Encounters:  04/09/24 131 lb 3.2 oz (59.5 kg)  01/30/24 131 lb 6.4 oz (59.6 kg)  01/04/22 134 lb (60.8 kg)    Physical Exam Vitals reviewed.  Constitutional:      Appearance: Normal appearance.  HENT:     Nose: Nose normal.     Mouth/Throat:     Mouth: Mucous membranes are moist.  Eyes:     General: No scleral icterus.    Conjunctiva/sclera: Conjunctivae normal.  Cardiovascular:  Rate and Rhythm: Normal rate and regular rhythm.     Heart sounds: No murmur heard.    No friction rub. No gallop.  Pulmonary:     Effort: Pulmonary effort is normal.     Breath sounds: No stridor. No wheezing, rhonchi or rales.  Abdominal:     General: Abdomen is flat.     Palpations: There is no mass.     Tenderness: There is no abdominal tenderness. There is no guarding.      Hernia: No hernia is present.  Musculoskeletal:        General: Normal range of motion.     Cervical back: Neck supple.  Lymphadenopathy:     Cervical: No cervical adenopathy.  Skin:    General: Skin is warm and dry.  Neurological:     General: No focal deficit present.     Mental Status: She is alert.  Psychiatric:        Mood and Affect: Mood normal.        Behavior: Behavior normal.     Lab Results  Component Value Date   WBC 5.6 10/07/2021   HGB 13.5 10/07/2021   HCT 39.7 10/07/2021   PLT 319.0 10/07/2021   GLUCOSE 85 10/07/2021   CHOL 175 12/24/2020   TRIG 95.0 12/24/2020   HDL 77.80 12/24/2020   LDLCALC 78 12/24/2020   ALT 11 10/07/2021   AST 17 10/07/2021   NA 139 10/07/2021   K 3.8 10/07/2021   CL 106 10/07/2021   CREATININE 0.80 10/07/2021   BUN 13 10/07/2021   CO2 26 10/07/2021   TSH 1.34 10/07/2021    MR Cervical Spine Wo Contrast Result Date: 01/09/2021 CLINICAL DATA:  Cervicogenic headache G44.86 (ICD-10-CM). Neck pain, chronic, no prior imaging. Reflexes abnormal R29.2 (ICD-10-CM). Cervicalgia M54.2 (ICD-10-CM). Additional history provided by scanning technologist: Patient reports headache in lower right portion of head and neck since February 2022. EXAM: MRI CERVICAL SPINE WITHOUT CONTRAST TECHNIQUE: Multiplanar, multisequence MR imaging of the cervical spine was performed. No intravenous contrast was administered. COMPARISON:  Same-day MRI/MRA head 01/09/2021. FINDINGS: Alignment: Mild reversal of the expected cervical lordosis. No significant spondylolisthesis. Vertebrae: Vertebral body height is maintained. No significant marrow edema or focal suspicious osseous lesion. Cord: No spinal cord signal abnormality is identified. Posterior Fossa, vertebral arteries, paraspinal tissues: Posterior fossa better assessed on same-day brain MRI. Flow voids preserved within the imaged cervical vertebral arteries. Paraspinal soft tissues unremarkable. Disc levels: Minimal  multilevel disc degeneration. C2-C3: No significant disc herniation or stenosis. C3-C4: Mild uncovertebral hypertrophy on the right. No significant disc herniation or stenosis. C4-C5: No significant disc herniation or stenosis. C5-C6: Small disc bulge. Superimposed shallow broad-based right center/right foraminal disc protrusion. Minimal partial effacement of the ventral thecal sac (without spinal cord mass effect). Minimal relative narrowing of the right neural foramen. C6-C7: No significant disc herniation or stenosis. C7-T1: No significant disc herniation or stenosis. IMPRESSION: Mild cervical spondylosis, as described and most notably as follows. At C5-C6, there is a small disc bulge. Superimposed shallow broad-based right center/foraminal disc protrusion. Minimal partial effacement of the ventral thecal sac (without spinal cord mass effect). Minimal right neural foraminal narrowing. Mild nonspecific reversal of the expected cervical lordosis. Electronically Signed   By: Rockey Childs DO   On: 01/09/2021 19:15   MR ANGIO HEAD WO CONTRAST Result Date: 01/09/2021 CLINICAL DATA:  Nonintractable episodic headache, unspecified headache type R51.9 (ICD-10-CM). Headache, new or worsening, neuro deficit (Age 48-49y). Reflexes abnormal R29.2 (ICD-10-CM).  Additional history provided by scanning technologist: Patient reports sharp pain/headaches in lower right portion of head and neck since February 2022. EXAM: MRA HEAD WITHOUT CONTRAST TECHNIQUE: Angiographic images of the Circle of Willis were acquired using MRA technique without intravenous contrast. COMPARISON:  Same-day MRI head and MR cervical spine 01/09/2021. FINDINGS: Anterior circulation: The intracranial internal carotid arteries are patent. The M1 middle cerebral arteries are patent. No M2 proximal branch occlusion or high-grade proximal stenosis is identified. The anterior cerebral arteries are patent. No intracranial aneurysm is identified. Posterior  circulation: The intracranial vertebral arteries are patent. The basilar artery is patent. The posterior cerebral arteries are patent. Posterior communicating arteries are present bilaterally. Anatomic variants: None significant IMPRESSION: Unremarkable exam. No intracranial large vessel occlusion or proximal high-grade arterial stenosis. No intracranial aneurysm is identified. Electronically Signed   By: Rockey Childs DO   On: 01/09/2021 19:06   MR Brain W Wo Contrast Result Date: 01/09/2021 CLINICAL DATA:  Nonintractable episodic headache, unspecified headache type R51.9 (ICD-10-CM). Headache, new or worsening, neuro deficit (Age 70-49y). Reflexes abnormal R29.2 (ICD-10-CM). Additional history provided: Patient reports sharp pain/headaches in lower right portion of head and neck since February 2022. EXAM: MRI HEAD WITHOUT AND WITH CONTRAST TECHNIQUE: Multiplanar, multiecho pulse sequences of the brain and surrounding structures were obtained without and with intravenous contrast. CONTRAST:  12mL MULTIHANCE  GADOBENATE DIMEGLUMINE  529 MG/ML IV SOLN COMPARISON:  Same day MRA head and MR cervical spine 01/09/2021. FINDINGS: Brain: Cerebral volume is normal. No cortical encephalomalacia is identified. No significant cerebral white matter disease. An apparent small focus of signal hyperintensity within the right pons on the axial T2/FLAIR sequence is not appreciated on the axial or coronal T2 T2 TSE sequences, and likely artifactual. There is no acute infarct. No evidence of an intracranial mass. No chronic intracranial blood products. No extra-axial fluid collection. No midline shift. No abnormal intracranial enhancement. Vascular: Maintained proximal arterial flow voids. Skull and upper cervical spine: No focal marrow lesion. Sinuses/Orbits: Visualized orbits show no acute finding. No significant paranasal sinus disease. Other: Small volume fluid within the left mastoid air cells. IMPRESSION: Unremarkable MRI  appearance of the brain. No evidence of acute intracranial abnormality. Small left mastoid effusion. Electronically Signed   By: Rockey Childs DO   On: 01/09/2021 18:59    Assessment & Plan:   ADHD (attention deficit hyperactivity disorder), inattentive type -     Lisdexamfetamine Dimesylate; Take 1 capsule (20 mg total) by mouth daily.  Dispense: 90 capsule; Refill: 0 -     CBC with Differential/Platelet; Future  Need for immunization against influenza -     Flu vaccine trivalent PF, 6mos and older(Flulaval,Afluria,Fluarix,Fluzone)  Other orders -     COVID-19 mRNA Vac-TriS(Pfizer); Inject 0.3 mLs into the muscle once for 1 dose.  Dispense: 0.3 mL; Refill: 0     Follow-up: Return in about 6 months (around 10/07/2024).  Debby Molt, MD

## 2024-04-17 ENCOUNTER — Ambulatory Visit: Admitting: Internal Medicine

## 2024-05-16 DIAGNOSIS — Z1151 Encounter for screening for human papillomavirus (HPV): Secondary | ICD-10-CM | POA: Diagnosis not present

## 2024-05-16 DIAGNOSIS — Z01419 Encounter for gynecological examination (general) (routine) without abnormal findings: Secondary | ICD-10-CM | POA: Diagnosis not present

## 2024-05-16 DIAGNOSIS — Z124 Encounter for screening for malignant neoplasm of cervix: Secondary | ICD-10-CM | POA: Diagnosis not present

## 2024-05-16 DIAGNOSIS — Z6823 Body mass index (BMI) 23.0-23.9, adult: Secondary | ICD-10-CM | POA: Diagnosis not present

## 2024-05-22 LAB — HM PAP SMEAR

## 2024-06-12 ENCOUNTER — Encounter: Payer: Self-pay | Admitting: Internal Medicine

## 2024-06-12 ENCOUNTER — Ambulatory Visit: Admitting: Internal Medicine

## 2024-06-12 VITALS — BP 106/72 | HR 76 | Temp 98.6°F | Ht 62.0 in | Wt 130.8 lb

## 2024-06-12 DIAGNOSIS — F900 Attention-deficit hyperactivity disorder, predominantly inattentive type: Secondary | ICD-10-CM | POA: Diagnosis not present

## 2024-06-12 MED ORDER — AMPHETAMINE-DEXTROAMPHETAMINE 10 MG PO TABS: 10.0000 mg | ORAL_TABLET | Freq: Two times a day (BID) | ORAL | 0 refills | Status: AC

## 2024-06-12 NOTE — Patient Instructions (Signed)
 Amphetamine; Dextroamphetamine Tablets What is this medication? AMPHETAMINE; DEXTROAMPHETAMINE (am FET a meen; dex troe am FET a meen) treats attention-deficit hyperactivity disorder (ADHD). It works by improving focus and reducing impulsive behavior. It may also be used to treat narcolepsy. It works by promoting wakefulness. It belongs to a group of medications called stimulants. This medicine may be used for other purposes; ask your health care provider or pharmacist if you have questions. COMMON BRAND NAME(S): Adderall What should I tell my care team before I take this medication? They need to know if you have any of these conditions: Anxiety or panic attacks Circulation problems in fingers or toes (Raynaud syndrome) Glaucoma Heart attack Heart disease High blood pressure Kidney disease Liver disease Mental health conditions Seizures Stroke Substance use disorder Suicidal thoughts, plans, or attempt by you or a family member Thyroid disease Tourette syndrome An unusual or allergic reaction to dextroamphetamine, other medications, foods, dyes, or preservatives Pregnant or trying to get pregnant Breastfeeding How should I use this medication? Take this medication by mouth. Take it as directed on the prescription label at the same time every day. You can take it with or without food. If it upsets your stomach, take it with food. Keep taking it unless your care team tells you to stop. A special MedGuide will be given to you by the pharmacist with each prescription and refill. Be sure to read this information carefully each time. Talk to your care team about the use of this medication in children. While it may be prescribed for children as young as 3 years for selected conditions, precautions do apply. Overdosage: If you think you have taken too much of this medicine contact a poison control center or emergency room at once. NOTE: This medicine is only for you. Do not share this medicine  with others. What if I miss a dose? If you miss a dose, take it as soon as you can. If it is almost time for your next dose, take only that dose. Do not take double or extra doses. What may interact with this medication? Do not take this medication with any of the following: Linezolid MAOIs, such as Marplan, Nardil, and Parnate Methylene blue This medication may also interact with the following: Acetazolamide Alcohol Ascorbic acid Certain medications for depression, anxiety, or other mental health conditions Certain medications for migraines, such as sumatriptan Guanethidine Opioids Reserpine Sodium bicarbonate St. John's wort Thiazide diuretics, such as chlorothiazide Tryptophan This list may not describe all possible interactions. Give your health care provider a list of all the medicines, herbs, non-prescription drugs, or dietary supplements you use. Also tell them if you smoke, drink alcohol, or use illegal drugs. Some items may interact with your medicine. What should I watch for while using this medication? Visit your care team for regular checks on your progress. Tell your care team if your symptoms do not start to get better or if they get worse. This medication requires a new prescription from your care team every time it is filled at the pharmacy. This medication can be abused and cause your brain and body to depend on it after high doses or long term use. Your care team will assess your risk and monitor you closely during treatment. Long term use of this medication may cause your brain and body to depend on it. You may be able to take breaks from this medication during weekends, holidays, or summer vacations. Talk to your care team about what works for you. If  your care team wants you to stop this medication permanently, the dose may be slowly lowered over time to reduce the risk of side effects. Tell your care team if this medication loses its effects, or if you feel you need  to take more than the prescribed amount. Do not change your dose without talking to your care team. Do not take this medication close to bedtime. It may prevent you from sleeping. Loss of appetite is common when starting this medication. Eating small, frequent meals or snacks can help. Talk to your care team if appetite loss persists. Children should have height and weight checked often while taking this medication. Tell your care team right away if you notice unexplained wounds on your fingers and toes while taking this medication. You should also tell your care team if you experience numbness or pain, changes in the skin color, or sensitivity to temperature in your fingers or toes. Contact your care team right away if you have an erection that lasts longer than 4 hours or if it becomes painful. This may be a sign of a serious problem and must be treated right away to prevent permanent damage. What side effects may I notice from receiving this medication? Side effects that you should report to your care team as soon as possible: Allergic reactions--skin rash, itching, hives, swelling of the face, lips, tongue, or throat Heart attack--pain or tightness in the chest, shoulders, arms, or jaw, nausea, shortness of breath, cold or clammy skin, feeling faint or lightheaded Heart rhythm changes--fast or irregular heartbeat, dizziness, feeling faint or lightheaded, chest pain, trouble breathing Increase in blood pressure Irritability, confusion, fast or irregular heartbeat, muscle stiffness, twitching muscles, sweating, high fever, seizure, chills, vomiting, diarrhea, which may be signs of serotonin syndrome Mood and behavior changes--anxiety, nervousness, confusion, hallucinations, irritability, hostility, thoughts of suicide or self-harm, worsening mood, feelings of depression Prolonged or painful erection Raynaud syndrome--cool, numb, or painful fingers or toes that may change color from pale, to blue, to  red Seizures Stroke--sudden numbness or weakness of the face, arm, or leg, trouble speaking, confusion, trouble walking, loss of balance or coordination, dizziness, severe headache, change in vision Side effects that usually do not require medical attention (report these to your care team if they continue or are bothersome): Dry mouth Headache Loss of appetite with weight loss Nausea Stomach pain Trouble sleeping This list may not describe all possible side effects. Call your doctor for medical advice about side effects. You may report side effects to FDA at 1-800-FDA-1088. Where should I keep my medication? Keep out of the reach of children and pets. This medication can be abused. Keep it in a safe place to protect it from theft. Do not share it with anyone. It is only for you. Selling or giving away this medication is dangerous and against the law. Store at room temperature between 20 and 25 degrees C (68 and 77 degrees F). Protect from light and moisture. Keep container tightly closed. Get rid of any unused medication after the expiration date. This medication may cause harm and death if it is taken by other adults, children, or pets. It is important to get rid of the medication as soon as you no longer need it, or it is expired. You can do this in two ways: Take the medication to a medication take-back program. Check with your pharmacy or law enforcement to find a location. If you cannot return the medication, check the label or package insert to see  if the medication should be thrown out in the garbage or flushed down the toilet. If you are not sure, ask your care team. If it is safe to put it in the trash, take the medication out of the container. Mix the medication with cat litter, dirt, coffee grounds, or other unwanted substance. Seal the mixture in a bag or container. Put it in the trash. NOTE: This sheet is a summary. It may not cover all possible information. If you have questions about  this medicine, talk to your doctor, pharmacist, or health care provider.  2024 Elsevier/Gold Standard (2023-05-06 00:00:00)

## 2024-06-12 NOTE — Progress Notes (Signed)
 "  Subjective:  Patient ID: Olivia Garner, female    DOB: [birth year]  Age: 39 y.o. MRN: [id number]  CC: Medication Follow Up (Vyvanse . )   HPI Olivia Garner presents for f/up ---  Discussed the use of AI scribe software for clinical note transcription with the patient, who gave verbal consent to proceed.  History of Present Illness Olivia Garner is a 39 year old female who presents with concerns about medication timing and its impact on her daily activities.  She has noticed improvements in her condition, feeling good overall, with a quieter mind, a cleaner house, and completed errands. However, she experiences a significant 'crash' after taking her medication, which affects her ability to maintain focus and energy throughout the day.  She describes having a solid two hours of productivity after taking her medication, but then feels like she is 'back to square one' for the rest of the day. 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  She is seeking more flexibility in her medication regimen, as her current schedule does not align well with her daily activities, especially on days with a heavy workload.  No side effects from the current dose, including no issues with appetite, sleep, mood, jitteriness, anxiety, palpitations, diarrhea, or cramping. Her last menstrual cycle was three weeks ago, on the nineteenth.     Outpatient Medications Prior to Visit  Medication Sig Dispense Refill   lisdexamfetamine  (VYVANSE ) 20 MG capsule Take 1 capsule (20 mg total) by mouth daily. 90 capsule 0   norgestimate-ethinyl estradiol (ORTHO-CYCLEN) 0.25-35 MG-MCG tablet Sprintec (28) 0.25 mg-35 mcg tablet     No facility-administered medications prior to visit.    ROS Review of Systems  Constitutional: Negative.  Negative for fatigue.  HENT: Negative.    Eyes: Negative.    Respiratory:  Negative for cough and chest tightness.   Cardiovascular:  Negative for chest pain, palpitations and leg swelling.  Gastrointestinal:  Negative for abdominal pain, constipation, diarrhea, nausea and vomiting.  Endocrine: Negative.   Genitourinary: Negative.  Negative for difficulty urinating.  Musculoskeletal: Negative.   Neurological: Negative.  Negative for dizziness, weakness and headaches.  Hematological:  Negative for adenopathy. Does not bruise/bleed easily.  Psychiatric/Behavioral:  Positive for decreased concentration. Negative for confusion, dysphoric mood and suicidal ideas. The patient is not nervous/anxious.     Objective:  BP 106/72 (BP Location: Left Arm, Patient Position: Sitting, Cuff Size: Normal)   Pulse 76   Temp 98.6 F (37 C) (Temporal)   Ht 5' 2 (1.575 m)   Wt 130 lb 12.8 oz (59.3 kg)   LMP 05/25/2024   SpO2 99%   BMI 23.92 kg/m   BP Readings from Last 3 Encounters:  06/12/24 106/72  04/09/24 108/76  01/30/24 120/84    Wt Readings from Last 3 Encounters:  06/12/24 130 lb 12.8 oz (59.3 kg)  04/09/24 131 lb 3.2 oz (59.5 kg)  01/30/24 131 lb 6.4 oz (59.6 kg)    Physical Exam Vitals reviewed.  HENT:     Nose: Nose normal.     Mouth/Throat:     Mouth: Mucous membranes are moist.  Eyes:     General: No scleral icterus.    Conjunctiva/sclera: Conjunctivae normal.  Cardiovascular:     Rate and Rhythm: Normal rate and regular rhythm.     Heart sounds: No murmur heard.    No friction  rub. No gallop.  Pulmonary:     Effort: Pulmonary effort is normal.     Breath sounds: No stridor. No wheezing, rhonchi or rales.  Abdominal:     General: Abdomen is flat.     Palpations: There is no mass.     Tenderness: There is no abdominal tenderness. There is no guarding.     Hernia: No hernia is present.  Musculoskeletal:        General: Normal range of motion.     Cervical back: Neck supple.     Right lower leg: No edema.     Left lower leg:  No edema.  Lymphadenopathy:     Cervical: No cervical adenopathy.  Skin:    General: Skin is warm and dry.  Neurological:     General: No focal deficit present.     Mental Status: She is alert.  Psychiatric:        Mood and Affect: Mood normal.        Behavior: Behavior normal.     Lab Results  Component Value Date   WBC 5.6 10/07/2021   HGB 13.5 10/07/2021   HCT 39.7 10/07/2021   PLT 319.0 10/07/2021   GLUCOSE 85 10/07/2021   CHOL 175 12/24/2020   TRIG 95.0 12/24/2020   HDL 77.80 12/24/2020   LDLCALC 78 12/24/2020   ALT 11 10/07/2021   AST 17 10/07/2021   NA 139 10/07/2021   K 3.8 10/07/2021   CL 106 10/07/2021   CREATININE 0.80 10/07/2021   BUN 13 10/07/2021   CO2 26 10/07/2021   TSH 1.34 10/07/2021    MR Cervical Spine Wo Contrast Result Date: 01/09/2021 CLINICAL DATA:  Cervicogenic headache G44.86 (ICD-10-CM). Neck pain, chronic, no prior imaging. Reflexes abnormal R29.2 (ICD-10-CM). Cervicalgia M54.2 (ICD-10-CM). Additional history provided by scanning technologist: Patient reports headache in lower right portion of head and neck since February 2022. EXAM: MRI CERVICAL SPINE WITHOUT CONTRAST TECHNIQUE: Multiplanar, multisequence MR imaging of the cervical spine was performed. No intravenous contrast was administered. COMPARISON:  Same-day MRI/MRA head 01/09/2021. FINDINGS: Alignment: Mild reversal of the expected cervical lordosis. No significant spondylolisthesis. Vertebrae: Vertebral body height is maintained. No significant marrow edema or focal suspicious osseous lesion. Cord: No spinal cord signal abnormality is identified. Posterior Fossa, vertebral arteries, paraspinal tissues: Posterior fossa better assessed on same-day brain MRI. Flow voids preserved within the imaged cervical vertebral arteries. Paraspinal soft tissues unremarkable. Disc levels: Minimal multilevel disc degeneration. C2-C3: No significant disc herniation or stenosis. C3-C4: Mild uncovertebral  hypertrophy on the right. No significant disc herniation or stenosis. C4-C5: No significant disc herniation or stenosis. C5-C6: Small disc bulge. Superimposed shallow broad-based right center/right foraminal disc protrusion. Minimal partial effacement of the ventral thecal sac (without spinal cord mass effect). Minimal relative narrowing of the right neural foramen. C6-C7: No significant disc herniation or stenosis. C7-T1: No significant disc herniation or stenosis. IMPRESSION: Mild cervical spondylosis, as described and most notably as follows. At C5-C6, there is a small disc bulge. Superimposed shallow broad-based right center/foraminal disc protrusion. Minimal partial effacement of the ventral thecal sac (without spinal cord mass effect). Minimal right neural foraminal narrowing. Mild nonspecific reversal of the expected cervical lordosis. Electronically Signed   By: Rockey Childs DO   On: 01/09/2021 19:15   MR ANGIO HEAD WO CONTRAST Result Date: 01/09/2021 CLINICAL DATA:  Nonintractable episodic headache, unspecified headache type R51.9 (ICD-10-CM). Headache, new or worsening, neuro deficit (Age 37-49y). Reflexes abnormal R29.2 (ICD-10-CM). Additional history provided by  scanning technologist: Patient reports sharp pain/headaches in lower right portion of head and neck since February 2022. EXAM: MRA HEAD WITHOUT CONTRAST TECHNIQUE: Angiographic images of the Circle of Willis were acquired using MRA technique without intravenous contrast. COMPARISON:  Same-day MRI head and MR cervical spine 01/09/2021. FINDINGS: Anterior circulation: The intracranial internal carotid arteries are patent. The M1 middle cerebral arteries are patent. No M2 proximal branch occlusion or high-grade proximal stenosis is identified. The anterior cerebral arteries are patent. No intracranial aneurysm is identified. Posterior circulation: The intracranial vertebral arteries are patent. The basilar artery is patent. The posterior cerebral  arteries are patent. Posterior communicating arteries are present bilaterally. Anatomic variants: None significant IMPRESSION: Unremarkable exam. No intracranial large vessel occlusion or proximal high-grade arterial stenosis. No intracranial aneurysm is identified. Electronically Signed   By: Rockey Childs DO   On: 01/09/2021 19:06   MR Brain W Wo Contrast Result Date: 01/09/2021 CLINICAL DATA:  Nonintractable episodic headache, unspecified headache type R51.9 (ICD-10-CM). Headache, new or worsening, neuro deficit (Age 50-49y). Reflexes abnormal R29.2 (ICD-10-CM). Additional history provided: Patient reports sharp pain/headaches in lower right portion of head and neck since February 2022. EXAM: MRI HEAD WITHOUT AND WITH CONTRAST TECHNIQUE: Multiplanar, multiecho pulse sequences of the brain and surrounding structures were obtained without and with intravenous contrast. CONTRAST:  12mL MULTIHANCE  GADOBENATE DIMEGLUMINE  529 MG/ML IV SOLN COMPARISON:  Same day MRA head and MR cervical spine 01/09/2021. FINDINGS: Brain: Cerebral volume is normal. No cortical encephalomalacia is identified. No significant cerebral white matter disease. An apparent small focus of signal hyperintensity within the right pons on the axial T2/FLAIR sequence is not appreciated on the axial or coronal T2 T2 TSE sequences, and likely artifactual. There is no acute infarct. No evidence of an intracranial mass. No chronic intracranial blood products. No extra-axial fluid collection. No midline shift. No abnormal intracranial enhancement. Vascular: Maintained proximal arterial flow voids. Skull and upper cervical spine: No focal marrow lesion. Sinuses/Orbits: Visualized orbits show no acute finding. No significant paranasal sinus disease. Other: Small volume fluid within the left mastoid air cells. IMPRESSION: Unremarkable MRI appearance of the brain. No evidence of acute intracranial abnormality. Small left mastoid effusion. Electronically  Signed   By: Rockey Childs DO   On: 01/09/2021 18:59    Assessment & Plan:   ADHD (attention deficit hyperactivity disorder), inattentive type -     Amphetamine -Dextroamphetamine ; Take 1 tablet (10 mg total) by mouth 2 (two) times daily with a meal.  Dispense: 180 tablet; Refill: 0     Follow-up: Return in about 6 months (around 12/10/2024).  Debby Molt, MD "

## 2024-06-28 ENCOUNTER — Ambulatory Visit: Admitting: Internal Medicine

## 2024-08-01 ENCOUNTER — Ambulatory Visit: Admitting: Internal Medicine

## 2024-10-08 ENCOUNTER — Ambulatory Visit: Admitting: Internal Medicine

## 2024-12-10 ENCOUNTER — Ambulatory Visit: Admitting: Internal Medicine
